# Patient Record
Sex: Female | Born: 1990 | Race: Black or African American | Hispanic: No | Marital: Single | Smoking: Former smoker
Health system: Southern US, Community
[De-identification: ages and names within clinical notes are randomized; demographics above are authoritative.]

## PROBLEM LIST (undated history)

## (undated) ENCOUNTER — Ambulatory Visit (HOSPITAL_COMMUNITY): Admission: EM | Payer: Medicaid Other | Source: Home / Self Care

## (undated) DIAGNOSIS — R51 Headache: Secondary | ICD-10-CM

## (undated) DIAGNOSIS — R519 Headache, unspecified: Secondary | ICD-10-CM

## (undated) DIAGNOSIS — F32A Depression, unspecified: Secondary | ICD-10-CM

## (undated) DIAGNOSIS — F419 Anxiety disorder, unspecified: Secondary | ICD-10-CM

## (undated) DIAGNOSIS — F259 Schizoaffective disorder, unspecified: Secondary | ICD-10-CM

## (undated) DIAGNOSIS — F329 Major depressive disorder, single episode, unspecified: Secondary | ICD-10-CM

## (undated) HISTORY — DX: Depression, unspecified: F32.A

## (undated) HISTORY — DX: Headache: R51

## (undated) HISTORY — DX: Headache, unspecified: R51.9

## (undated) HISTORY — DX: Major depressive disorder, single episode, unspecified: F32.9

---

## 2013-07-25 HISTORY — PX: FINGER SURGERY: SHX640

## 2014-11-25 ENCOUNTER — Encounter (HOSPITAL_COMMUNITY): Payer: Self-pay | Admitting: *Deleted

## 2014-11-25 ENCOUNTER — Emergency Department (HOSPITAL_COMMUNITY)
Admission: EM | Admit: 2014-11-25 | Discharge: 2014-11-25 | Disposition: A | Discharge status: Home / Self Care | Payer: Self-pay | Attending: Emergency Medicine | Admitting: Emergency Medicine

## 2014-11-25 DIAGNOSIS — M549 Dorsalgia, unspecified: Secondary | ICD-10-CM

## 2014-11-25 DIAGNOSIS — A599 Trichomoniasis, unspecified: Secondary | ICD-10-CM | POA: Insufficient documentation

## 2014-11-25 DIAGNOSIS — Z3202 Encounter for pregnancy test, result negative: Secondary | ICD-10-CM | POA: Insufficient documentation

## 2014-11-25 DIAGNOSIS — N898 Other specified noninflammatory disorders of vagina: Secondary | ICD-10-CM | POA: Insufficient documentation

## 2014-11-25 DIAGNOSIS — G8929 Other chronic pain: Secondary | ICD-10-CM | POA: Insufficient documentation

## 2014-11-25 LAB — WET PREP, GENITAL
Clue Cells Wet Prep HPF POC: NONE SEEN
Yeast Wet Prep HPF POC: NONE SEEN

## 2014-11-25 LAB — URINALYSIS, ROUTINE W REFLEX MICROSCOPIC
BILIRUBIN URINE: NEGATIVE
Glucose, UA: NEGATIVE mg/dL
HGB URINE DIPSTICK: NEGATIVE
Ketones, ur: 15 mg/dL — AB
Nitrite: NEGATIVE
PROTEIN: NEGATIVE mg/dL
Specific Gravity, Urine: 1.024 (ref 1.005–1.030)
Urobilinogen, UA: 1 mg/dL (ref 0.0–1.0)
pH: 7.5 (ref 5.0–8.0)

## 2014-11-25 LAB — URINE MICROSCOPIC-ADD ON

## 2014-11-25 LAB — POC URINE PREG, ED: Preg Test, Ur: NEGATIVE

## 2014-11-25 MED ORDER — ONDANSETRON HCL 4 MG PO TABS
4.0000 mg | ORAL_TABLET | Freq: Once | ORAL | Status: AC
  Administered 2014-11-25: 4 mg via ORAL
  Filled 2014-11-25: qty 1

## 2014-11-25 MED ORDER — AZITHROMYCIN 250 MG PO TABS
1000.0000 mg | ORAL_TABLET | Freq: Once | ORAL | Status: AC
  Administered 2014-11-25: 1000 mg via ORAL
  Filled 2014-11-25: qty 4

## 2014-11-25 MED ORDER — ONDANSETRON HCL 4 MG/2ML IJ SOLN: 4.0000 mg | Freq: Once | INTRAMUSCULAR | Status: DC

## 2014-11-25 MED ORDER — LIDOCAINE HCL (PF) 1 % IJ SOLN: 0.9000 mL | Freq: Once | INTRAMUSCULAR | Status: DC

## 2014-11-25 MED ORDER — CEFTRIAXONE SODIUM 250 MG IJ SOLR
250.0000 mg | Freq: Once | INTRAMUSCULAR | Status: DC
  Filled 2014-11-25: qty 250

## 2014-11-25 MED ORDER — LIDOCAINE HCL (PF) 1 % IJ SOLN
9.0000 mL | Freq: Once | INTRAMUSCULAR | Status: DC
  Filled 2014-11-25: qty 10

## 2014-11-25 MED ORDER — METRONIDAZOLE 500 MG PO TABS
2000.0000 mg | ORAL_TABLET | Freq: Once | ORAL | Status: AC
  Administered 2014-11-25: 2000 mg via ORAL
  Filled 2014-11-25: qty 4
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
## 2014-11-26 LAB — URINE CULTURE

## 2015-02-02 ENCOUNTER — Emergency Department (HOSPITAL_COMMUNITY)
Admission: EM | Admit: 2015-02-02 | Discharge: 2015-02-02 | Disposition: A | Discharge status: Home / Self Care | Payer: Self-pay | Attending: Emergency Medicine | Admitting: Emergency Medicine

## 2015-02-02 ENCOUNTER — Encounter (HOSPITAL_COMMUNITY): Payer: Self-pay | Admitting: Emergency Medicine

## 2015-02-02 DIAGNOSIS — R51 Headache: Secondary | ICD-10-CM | POA: Insufficient documentation

## 2015-02-02 DIAGNOSIS — R6883 Chills (without fever): Secondary | ICD-10-CM | POA: Insufficient documentation

## 2015-02-02 DIAGNOSIS — J029 Acute pharyngitis, unspecified: Secondary | ICD-10-CM | POA: Insufficient documentation

## 2015-02-02 DIAGNOSIS — M791 Myalgia: Secondary | ICD-10-CM | POA: Insufficient documentation

## 2015-02-02 LAB — RAPID STREP SCREEN (MED CTR MEBANE ONLY): Streptococcus, Group A Screen (Direct): NEGATIVE

## 2015-02-02 NOTE — ED Notes (Signed)
Pt. reports sore throat with swelling , mild chills and body aches onset 2 days ago .

## 2015-02-02 NOTE — ED Provider Notes (Signed)
CSN: [id number]     Arrival date & time 02/02/15  2001 History  This chart was scribed for non-physician practitioner, Cheron SchaumannLeslie Sofia, PA-C working with Gerhard Munchobert Lockwood, MD by Placido SouLogan Joldersma, ED scribe. This patient was seen in room TR07C/TR07C and the patient's care was started at 8:26 PM.     Chief Complaint  Patient presents with  . Sore Throat   Patient is a 24 y.o. female presenting with pharyngitis. The history is provided by the patient. No language interpreter was used.  Sore Throat Associated symptoms include headaches.    HPI Comments: Shelby Shelton is a 24 y.o. female who presents to the Emergency Department complaining of a worsening, mild, sore throat with onset 2 days ago. Pt notes associated chills, myalgias, HA, and trouble swallowing. She notes taking Theraflu for her symptoms and experiencing short term relief. She denies taking any medications or any known drug allergies. She denies any other associated symptoms.    History reviewed. No pertinent past medical history. Past Surgical History  Procedure Laterality Date  . Finger surgery     No family history on file. History  Substance Use Topics  . Smoking status: Never Smoker   . Smokeless tobacco: Not on file  . Alcohol Use: No   OB History    No data available     Review of Systems  Constitutional: Positive for chills.  HENT: Positive for sore throat and trouble swallowing.   Musculoskeletal: Positive for myalgias.  Neurological: Positive for headaches.  All other systems reviewed and are negative.  Allergies  Review of patient's allergies indicates no known allergies.  Home Medications   Prior to Admission medications   Not on File   BP 120/80 mmHg  Pulse 85  Temp(Src) 98.6 F (37 C) (Oral)  Resp 14  Ht 5\' 3"  (1.6 m)  Wt 92 lb (41.731 kg)  BMI 16.30 kg/m2  SpO2 98%  LMP 01/19/2015 Physical Exam  Constitutional: She is oriented to person, place, and time. She appears well-developed and  well-nourished. No distress.  HENT:  Head: Normocephalic and atraumatic.  Mouth/Throat: Oropharynx is clear and moist.  Eyes: Conjunctivae and EOM are normal. Pupils are equal, round, and reactive to light.  Neck: Normal range of motion. Neck supple. No tracheal deviation present.  Cardiovascular: Normal rate.   Pulmonary/Chest: Breath sounds normal. No respiratory distress.  Abdominal: Soft.  Musculoskeletal: Normal range of motion.  Neurological: She is alert and oriented to person, place, and time.  Skin: Skin is warm and dry.  Psychiatric: She has a normal mood and affect. Her behavior is normal.  Nursing note and vitals reviewed.   ED Course  Procedures  DIAGNOSTIC STUDIES: Oxygen Saturation is 98% on RA, normal by my interpretation.    COORDINATION OF CARE: 8:28 PM Discussed treatment plan with pt at bedside including a strep test and pt agreed to plan.  Labs Review Labs Reviewed  RAPID STREP SCREEN (NOT AT [facility])    Imaging Review No results found.   EKG Interpretation None      MDM  Strep negarive   Final diagnoses:  Pharyngitis     I personally performed the services in this documentation, which was scribed in my presence.  The recorded information has been reviewed and considered.   Barnet PallKaren SofiaPAC.  Elson AreasLeslie K Sofia, PA-C 02/02/15 2122  Elson AreasLeslie K Sofia, PA-C 02/02/15 2124  Gerhard Munchobert Lockwood, MD 02/03/15 [phone]

## 2015-02-02 NOTE — Discharge Instructions (Signed)

## 2015-02-05 LAB — CULTURE, GROUP A STREP: STREP A CULTURE: NEGATIVE

## 2017-05-21 ENCOUNTER — Emergency Department (HOSPITAL_COMMUNITY): Payer: Self-pay

## 2017-05-21 ENCOUNTER — Emergency Department (HOSPITAL_COMMUNITY)
Admission: EM | Admit: 2017-05-21 | Discharge: 2017-05-21 | Disposition: A | Discharge status: Home / Self Care | Payer: Self-pay | Attending: Emergency Medicine | Admitting: Emergency Medicine

## 2017-05-21 ENCOUNTER — Encounter (HOSPITAL_COMMUNITY): Payer: Self-pay | Admitting: Emergency Medicine

## 2017-05-21 DIAGNOSIS — G44209 Tension-type headache, unspecified, not intractable: Secondary | ICD-10-CM | POA: Insufficient documentation

## 2017-05-21 NOTE — Discharge Instructions (Signed)
Your headache is most likely stress or tension related.  Take Tylenol every 4 hours as needed for pain.  Follow-up with a primary care doctor if you are not better in 1 or 2 weeks.
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
## 2017-05-21 NOTE — ED Notes (Addendum)
The pthas had a headache for 1-2 mmonths intermittently  Woke up with a heradache this aM THEN WENT AWAY  SHE WENT OT WORK AND THE HEADACHE RETURNED   SHE TOOK TYLENOL THAT TOOK HER HEADAXCHE AWAY FOR APPROS 15-20 MINUTES THEN THE HEADACHE RETURNED

## 2017-05-21 NOTE — ED Notes (Signed)
TO CT

## 2017-05-21 NOTE — ED Triage Notes (Signed)
Pt brought in by EMS from work with c/o intermittent headache x 1 month. Pt has tried tylenol without relief. When headaches began pt called EMS and was found to have elevated BP but refused transport at that time. Today BP within normal limits.

## 2017-05-21 NOTE — ED Notes (Signed)
PT RETURNED FROM [facility] ASKING   FOR FOOD

## 2017-05-28 ENCOUNTER — Ambulatory Visit (HOSPITAL_COMMUNITY)
Admission: EM | Admit: 2017-05-28 | Discharge: 2017-05-28 | Disposition: A | Discharge status: Home / Self Care | Payer: Self-pay | Attending: Family Medicine | Admitting: Family Medicine

## 2017-05-28 ENCOUNTER — Encounter (HOSPITAL_COMMUNITY): Payer: Self-pay | Admitting: *Deleted

## 2017-05-28 DIAGNOSIS — F5101 Primary insomnia: Secondary | ICD-10-CM

## 2017-05-28 DIAGNOSIS — F4323 Adjustment disorder with mixed anxiety and depressed mood: Secondary | ICD-10-CM

## 2017-05-28 MED ORDER — FLUOXETINE HCL 10 MG PO TABS: 10.0000 mg | ORAL_TABLET | Freq: Every day | ORAL | 3 refills | Status: DC
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
## 2017-05-28 NOTE — ED Triage Notes (Signed)
C/O HAs intermittently over past 2 months, along with feeling anxious.  States under a lot of stress right now; feels depressed.  Denies suicidal or homicidal ideation.
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
## 2017-06-07 ENCOUNTER — Inpatient Hospital Stay: Payer: Self-pay

## 2017-10-19 ENCOUNTER — Other Ambulatory Visit: Payer: Self-pay

## 2017-10-19 ENCOUNTER — Encounter (HOSPITAL_COMMUNITY): Payer: Self-pay | Admitting: Emergency Medicine

## 2017-10-19 ENCOUNTER — Ambulatory Visit (HOSPITAL_COMMUNITY)
Admission: EM | Admit: 2017-10-19 | Discharge: 2017-10-19 | Disposition: A | Discharge status: Home / Self Care | Payer: Self-pay | Attending: Emergency Medicine | Admitting: Emergency Medicine

## 2017-10-19 DIAGNOSIS — B360 Pityriasis versicolor: Secondary | ICD-10-CM

## 2017-10-19 MED ORDER — KETOCONAZOLE 2 % EX SHAM: MEDICATED_SHAMPOO | CUTANEOUS | 0 refills | Status: DC

## 2017-10-19 NOTE — ED Provider Notes (Signed)
[facility]    CSN: [id number] Arrival date & time: 10/19/17  1241     History   Chief Complaint Chief Complaint  Patient presents with  . Rash    HPI Shelby Shelton is a 27 y.o. female.   Lavona presents with complaints of rash to her back she has noticed over the past 1-2 months. She feels the affected area has gotten larger. Denies  Any new products, soaps shampoos etc. She applies oil to her skin after showering. Denies any previous similar. Has not tried any treatments for her symptoms. It is not painful and is rarely itchy. Has not drained or flaked.    ROS per HPI.      History reviewed. No pertinent past medical history.  There are no active problems to display for this patient.   Past Surgical History:  Procedure Laterality Date  . FINGER SURGERY      OB History   None      Home Medications    Prior to Admission medications   Medication Sig Start Date End Date Taking? Authorizing Provider  ketoconazole (NIZORAL) 2 % shampoo Apply to back and leave on for 5 minutes, once a day for 1 week, then three times a week until resolved 10/19/17   Georgetta Haber, NP    Family History Family History  Problem Relation Age of Onset  . Hypertension Mother     Social History Social History   Tobacco Use  . Smoking status: Never Smoker  . Smokeless tobacco: Never Used  Substance Use Topics  . Alcohol use: No  . Drug use: No     Allergies   Patient has no known allergies.   Review of Systems Review of Systems   Physical Exam Triage Vital Signs ED Triage Vitals  Enc Vitals Group     BP 10/19/17 1345 115/82     Pulse Rate 10/19/17 1345 83     Resp 10/19/17 1345 18     Temp 10/19/17 1345 98.1 F (36.7 C)     Temp Source 10/19/17 1345 Oral     SpO2 10/19/17 1345 99 %     Weight --      Height --      Head Circumference --      Peak Flow --      Pain Score 10/19/17 1343 0     Pain Loc --      Pain Edu? --      Excl. in  GC? --    No data found.  Updated Vital Signs BP 115/82 (BP Location: Left Arm)   Pulse 83   Temp 98.1 F (36.7 C) (Oral)   Resp 18   LMP 09/28/2017   SpO2 99%   Visual Acuity Right Eye Distance:   Left Eye Distance:   Bilateral Distance:    Right Eye Near:   Left Eye Near:    Bilateral Near:     Physical Exam  Constitutional: She is oriented to person, place, and time. She appears well-developed and well-nourished. No distress.  Cardiovascular: Normal rate, regular rhythm and normal heart sounds.  Pulmonary/Chest: Effort normal and breath sounds normal.  Neurological: She is alert and oriented to person, place, and time.  Skin: Skin is warm and dry. Rash noted.  Flat hypopigmented rash to back noted without flaky or raised margins; see photos          UC Treatments / Results  Labs (all labs ordered are listed, but  only abnormal results are displayed) Labs Reviewed - No data to display  EKG None Radiology No results found.  Procedures Procedures (including critical care time)  Medications Ordered in UC Medications - No data to display   Initial Impression / Assessment and Plan / UC Course  I have reviewed the triage vital signs and the nursing notes.  Pertinent labs & imaging results that were available during my care of the patient were reviewed by me and considered in my medical decision making (see chart for details).     Ketoconazole shampoo provided at this time. If symptoms worsen or do not improve in the next 1-3 weeks to return to be seen or to follow up with PCP.  Patient verbalized understanding and agreeable to plan.    Final Clinical Impressions(s) / UC Diagnoses   Final diagnoses:  Tinea versicolor    ED Discharge Orders        Ordered    ketoconazole (NIZORAL) 2 % shampoo     10/19/17 1438       Controlled Substance Prescriptions [place] Controlled Substance Registry consulted? Not Applicable   Georgetta HaberBurky, Natalie B, NP 10/19/17  1444

## 2017-10-19 NOTE — Discharge Instructions (Signed)
Use of shampoo to back for the next week, leave on for 5 minutes then rinse off. Then three times a week until resolves. If symptoms worsen or do not improve in the next 1-3 weeks to return to be seen or to follow up with a PCP.

## 2017-10-19 NOTE — ED Triage Notes (Signed)
Describes white specks of lighter skin on her back noticed 1-2 months ago.  Specks are increasing

## 2017-12-09 ENCOUNTER — Encounter (HOSPITAL_COMMUNITY): Payer: Self-pay | Admitting: Emergency Medicine

## 2017-12-09 ENCOUNTER — Ambulatory Visit (HOSPITAL_COMMUNITY)
Admission: EM | Admit: 2017-12-09 | Discharge: 2017-12-09 | Disposition: A | Discharge status: Home / Self Care | Payer: Self-pay | Attending: Internal Medicine | Admitting: Internal Medicine

## 2017-12-09 ENCOUNTER — Other Ambulatory Visit: Payer: Self-pay

## 2017-12-09 DIAGNOSIS — R5383 Other fatigue: Secondary | ICD-10-CM

## 2017-12-09 LAB — POCT I-STAT, CHEM 8
BUN: 14 mg/dL (ref 6–20)
CHLORIDE: 103 mmol/L (ref 101–111)
Calcium, Ion: 1.17 mmol/L (ref 1.15–1.40)
Creatinine, Ser: 0.6 mg/dL (ref 0.44–1.00)
GLUCOSE: 80 mg/dL (ref 65–99)
HEMATOCRIT: 44 % (ref 36.0–46.0)
HEMOGLOBIN: 15 g/dL (ref 12.0–15.0)
POTASSIUM: 3.8 mmol/L (ref 3.5–5.1)
Sodium: 140 mmol/L (ref 135–145)
TCO2: 25 mmol/L (ref 22–32)

## 2017-12-09 MED ORDER — ONDANSETRON 4 MG PO TBDP: 4.0000 mg | ORAL_TABLET | Freq: Three times a day (TID) | ORAL | 0 refills | Status: DC | PRN

## 2017-12-09 NOTE — Discharge Instructions (Addendum)
Please use zofran as needed  Please work on getting longer episodes of sleep; at least 6 hours  Please increase fluid intake and food intake

## 2017-12-09 NOTE — ED Triage Notes (Signed)
Patient reports a week of feeling low energy.  Patient has had difficulty sleeping over the past week.  Patient has a headache as well

## 2017-12-10 NOTE — ED Provider Notes (Signed)
[facility]    CSN: [id number] Arrival date & time: 12/09/17  1604     History   Chief Complaint Chief Complaint  Patient presents with  . Dizziness    HPI Shelby Shelton is a 27 y.o. female no significant past medical history presenting today for evaluation of fatigue.  Patient states that she has felt really drained and has had difficulty sleeping over the past week.  She has had low energy as well as having more frequent headaches-taking BC powder for headaches.  She has occasional nausea associated with her fatigue.  And decreased appetite.  Denies any dizziness and lightheadedness.  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  She feels she is overworked, frequently working 9 days in a row with only 1 day off and working multiple shifts back to back.  States that she typically only gets 3 to 4 hours of sleep at night.  Does endorse trying to eat relatively healthfully eating mainly salads.  Last menstrual period was around 5/5, not on any form of birth control.  Did note that this past menstrual cycle was heavier than normal and lasted approximately 9 days which is longer than normal for her.  Denies any abdominal pain.  Denies being sexually active.  Patient was seen here in December for similar symptoms, patient states that this feels like another episode of this.  She was started on Prozac which she has taken off and on since then.  States that this did help her, especially with sleep.  Patient does not have a PCP.  HPI  History reviewed. No pertinent past medical history.  There are no active problems to display for this patient.   Past Surgical History:  Procedure Laterality Date  . FINGER SURGERY      OB History   None      Home Medications    Prior to Admission medications   Medication Sig Start Date End Date Taking? Authorizing Provider  ketoconazole (NIZORAL) 2 % shampoo Apply to back and leave on for 5  minutes, once a day for 1 week, then three times a week until resolved 10/19/17   Linus Mako B, NP  ondansetron (ZOFRAN ODT) 4 MG disintegrating tablet Take 1 tablet (4 mg total) by mouth every 8 (eight) hours as needed for nausea or vomiting. 12/09/17   Lew Dawes, PA-C    Family History Family History  Problem Relation Age of Onset  . Hypertension Mother     Social History Social History   Tobacco Use  . Smoking status: Never Smoker  . Smokeless tobacco: Never Used  Substance Use Topics  . Alcohol use: No  . Drug use: No     Allergies   Patient has no known allergies.   Review of Systems Review of Systems  Constitutional: Positive for activity change, appetite change and fatigue. Negative for chills and fever.  HENT: Negative for congestion, ear pain and sore throat.   Eyes: Negative for visual disturbance.  Respiratory: Negative for cough and shortness of breath.   Cardiovascular: Negative for chest pain and palpitations.  Gastrointestinal: Positive for nausea. Negative for abdominal pain, blood in stool and vomiting.  Musculoskeletal: Negative for arthralgias and back pain.  Skin: Negative for color change and rash.  Neurological: Positive for headaches. Negative for seizures, syncope, weakness, light-headedness and numbness.  All other systems reviewed and are negative.    Physical Exam Triage Vital Signs ED Triage Vitals  Enc Vitals  Group     BP 12/09/17 1728 126/79     Pulse Rate 12/09/17 1728 77     Resp 12/09/17 1728 16     Temp 12/09/17 1728 98 F (36.7 C)     Temp Source 12/09/17 1728 Oral     SpO2 12/09/17 1728 100 %     Weight --      Height --      Head Circumference --      Peak Flow --      Pain Score 12/09/17 1726 8     Pain Loc --      Pain Edu? --      Excl. in GC? --    No data found.  Updated Vital Signs BP 126/79 (BP Location: Left Arm)   Pulse 77   Temp 98 F (36.7 C) (Oral)   Resp 16   SpO2 100%   Visual  Acuity Right Eye Distance:   Left Eye Distance:   Bilateral Distance:    Right Eye Near:   Left Eye Near:    Bilateral Near:     Physical Exam  Constitutional: She is oriented to person, place, and time. She appears well-developed and well-nourished. No distress.  HENT:  Head: Normocephalic and atraumatic.  Mouth/Throat: Oropharynx is clear and moist.  Eyes: Conjunctivae are normal.  Neck: Neck supple.  Cardiovascular: Normal rate and regular rhythm.  No murmur heard. Pulmonary/Chest: Effort normal and breath sounds normal. No respiratory distress.  Abdominal: Soft. There is no tenderness.  Nontender to light and deep palpation  Musculoskeletal: She exhibits no edema.  Neurological: She is alert and oriented to person, place, and time.  Patient A&O x3, cranial nerves II-XII grossly intact, strength at shoulders, hips and knees 5/5, equal bilaterally. Normal Finger to nose, RAM and heel to shin. Negative Romberg and Pronator Drift. Gait without abnormality.   Skin:  Bilateral hands cold and sweaty  Psychiatric: She has a normal mood and affect.  Nursing note and vitals reviewed.    UC Treatments / Results  Labs (all labs ordered are listed, but only abnormal results are displayed) Labs Reviewed  POCT I-STAT, CHEM 8    EKG None  Radiology No results found.  Procedures Procedures (including critical care time)  Medications Ordered in UC Medications - No data to display  Initial Impression / Assessment and Plan / UC Course  I have reviewed the triage vital signs and the nursing notes.  Pertinent labs & imaging results that were available during my care of the patient were reviewed by me and considered in my medical decision making (see chart for details).     Patient with significant fatigue, possible underlying depression/anxiety.  Patient improved by taking Prozac previously, states that she does have a couple refills.  She is interested in restarting this.   I-STAT obtained, hemoglobin normal, electrolytes normal glucose normal.  Discussed certain lifestyle changes including better sleep hygiene, increase fluid intake as well as increasing her diet to provide her with more energy.  Will provide her Zofran to use as needed for nausea.  Recommended NSAIDs for headache.  Discussed with patient getting set up with PCP.  Work note provided for 2 days to stay home and rest.  Discussed strict return precautions. Patient verbalized understanding and is agreeable with plan.  Final Clinical Impressions(s) / UC Diagnoses   Final diagnoses:  Fatigue, unspecified type     Discharge Instructions     Please use zofran as needed  Please work on getting longer episodes of sleep; at least 6 hours  Please increase fluid intake and food intake   ED Prescriptions    Medication Sig Dispense Auth. Provider   ondansetron (ZOFRAN ODT) 4 MG disintegrating tablet Take 1 tablet (4 mg total) by mouth every 8 (eight) hours as needed for nausea or vomiting. 20 tablet Wieters, [place] C, PA-C     Controlled Substance Prescriptions [place] Controlled Substance Registry consulted? Not Applicable   Lew Dawes, [place] 12/10/17 1048

## 2017-12-12 ENCOUNTER — Telehealth (HOSPITAL_COMMUNITY): Payer: Self-pay | Admitting: Emergency Medicine

## 2017-12-12 ENCOUNTER — Encounter (HOSPITAL_COMMUNITY): Payer: Self-pay | Admitting: Family Medicine

## 2017-12-12 ENCOUNTER — Ambulatory Visit (HOSPITAL_COMMUNITY)
Admission: EM | Admit: 2017-12-12 | Discharge: 2017-12-12 | Disposition: A | Discharge status: Home / Self Care | Payer: Self-pay | Attending: Family Medicine | Admitting: Family Medicine

## 2017-12-12 DIAGNOSIS — G44209 Tension-type headache, unspecified, not intractable: Secondary | ICD-10-CM

## 2017-12-12 MED ORDER — NAPROXEN 500 MG PO TABS: 500.0000 mg | ORAL_TABLET | Freq: Two times a day (BID) | ORAL | 0 refills | Status: DC

## 2017-12-12 NOTE — Telephone Encounter (Signed)
Attempted to return pt call without answer

## 2017-12-12 NOTE — ED Provider Notes (Signed)
[facility]    CSN: [id number] Arrival date & time: 12/12/17  1503     History   Chief Complaint Chief Complaint  Patient presents with  . Headache    HPI Shelby Shelton is a 27 y.o. female no significant past medical history presenting today for evaluation of headaches.  Patient states that she has frequent headaches, will take [facility] powders which will relieve them temporarily.  Associated with phonophobia, denies photophobia.  Occasional blurry vision, but nothing persistent.  Denies any nausea or vomiting.  Patient was recently here for fatigue/tired related to over working/stress.  Patient states that this is improved as she was able to rest over the weekend.  She began to take fluoxetine as this previously it helped her with sleeping, but states that this is not helped her headaches.  She was seen and October/2018 for frequent headaches in the emergency room, CT scan showed no abnormalities.  Patient requesting referral to neurology.  HPI  History reviewed. No pertinent past medical history.  There are no active problems to display for this patient.   Past Surgical History:  Procedure Laterality Date  . FINGER SURGERY      OB History   None      Home Medications    Prior to Admission medications   Medication Sig Start Date End Date Taking? Authorizing Provider  ketoconazole (NIZORAL) 2 % shampoo Apply to back and leave on for 5 minutes, once a day for 1 week, then three times a week until resolved 10/19/17   Linus Mako B, NP  naproxen (NAPROSYN) 500 MG tablet Take 1 tablet (500 mg total) by mouth 2 (two) times daily. 12/12/17   Tkai Serfass C, PA-C  ondansetron (ZOFRAN ODT) 4 MG disintegrating tablet Take 1 tablet (4 mg total) by mouth every 8 (eight) hours as needed for nausea or vomiting. 12/09/17   Sheenah Dimitroff, Junius Creamer, PA-C    Family History Family History  Problem Relation Age of Onset  . Hypertension Mother     Social History Social History     Tobacco Use  . Smoking status: Never Smoker  . Smokeless tobacco: Never Used  Substance Use Topics  . Alcohol use: No  . Drug use: No     Allergies   Patient has no known allergies.   Review of Systems Review of Systems  Constitutional: Positive for fatigue. Negative for activity change, appetite change, chills and fever.  HENT: Negative for ear pain, sore throat and trouble swallowing.   Eyes: Negative for photophobia, pain and visual disturbance.  Respiratory: Negative for cough and shortness of breath.   Cardiovascular: Negative for chest pain and palpitations.  Gastrointestinal: Negative for abdominal pain, nausea and vomiting.  Musculoskeletal: Negative for back pain, neck pain and neck stiffness.  Skin: Negative for color change, rash and wound.  Neurological: Positive for headaches. Negative for dizziness, seizures, syncope, weakness, light-headedness and numbness.  All other systems reviewed and are negative.    Physical Exam Triage Vital Signs ED Triage Vitals  Enc Vitals Group     BP 12/12/17 1617 128/87     Pulse Rate 12/12/17 1617 75     Resp 12/12/17 1617 18     Temp 12/12/17 1617 98.3 F (36.8 C)     Temp src --      SpO2 12/12/17 1617 99 %     Weight --      Height --      Head Circumference --  Peak Flow --      Pain Score 12/12/17 1606 7     Pain Loc --      Pain Edu? --      Excl. in GC? --    No data found.  Updated Vital Signs BP 128/87   Pulse 75   Temp 98.3 F (36.8 C)   Resp 18   SpO2 99%   Visual Acuity Right Eye Distance:   Left Eye Distance:   Bilateral Distance:    Right Eye Near:   Left Eye Near:    Bilateral Near:     Physical Exam  Constitutional: She is oriented to person, place, and time. She appears well-developed and well-nourished. No distress.  HENT:  Head: Normocephalic and atraumatic.  Eyes: Conjunctivae are normal.  Neck: Neck supple.  Cardiovascular: Normal rate and regular rhythm.  No murmur  heard. Pulmonary/Chest: Effort normal and breath sounds normal. No respiratory distress.  Abdominal: Soft. There is no tenderness.  Musculoskeletal: She exhibits no edema.  Neurological: She is alert and oriented to person, place, and time.  Patient A&O x3, cranial nerves II-XII grossly intact, strength at shoulders, hips and knees 5/5, equal bilaterally.  Gait without abnormality.   Skin: Skin is warm and dry.  Psychiatric: She has a normal mood and affect.  Nursing note and vitals reviewed.    UC Treatments / Results  Labs (all labs ordered are listed, but only abnormal results are displayed) Labs Reviewed - No data to display  EKG None  Radiology No results found.  Procedures Procedures (including critical care time)  Medications Ordered in UC Medications - No data to display  Initial Impression / Assessment and Plan / UC Course  I have reviewed the triage vital signs and the nursing notes.  Pertinent labs & imaging results that were available during my care of the patient were reviewed by me and considered in my medical decision making (see chart for details).     Patient with recurrent headaches, offered shot of Toradol/Decadron.  Headache appears tension type, no focal neuro deficits.  Patient declined.  Also offered to check TSH/B12, patient declined.  Will provide Naprosyn to use at home orally.  Discussed getting set up with [facility], provided contact info for neurology. Final Clinical Impressions(s) / UC Diagnoses   Final diagnoses:  Acute non intractable tension-type headache     Discharge Instructions     Naprosyn twice daily as needed for headaches or may try over the counter alleeve Or  Use anti-inflammatories for pain/swelling. You may take up to 800 mg Ibuprofen every 8 hours with food. You may supplement Ibuprofen with Tylenol [phone] mg every 8 hours.      ED Prescriptions    Medication Sig Dispense Auth. Provider    naproxen (NAPROSYN) 500 MG tablet  (Status: Discontinued) Take 1 tablet (500 mg total) by mouth 2 (two) times daily. 30 tablet Darcie Mellone C, PA-C   naproxen (NAPROSYN) 500 MG tablet Take 1 tablet (500 mg total) by mouth 2 (two) times daily. 30 tablet Haleem Hanner, [place] C, PA-C     Controlled Substance Prescriptions [place] Controlled Substance Registry consulted? Not Applicable   Lew Dawes, [place] 12/12/17 1725

## 2017-12-12 NOTE — ED Triage Notes (Signed)
Pt here today with complaints of headaches. This has been ongoing for week and she has been taking tension headache meds OTC, she has had an MRI done and recently was prescribed fluoxetine to take 2 x a day here recently. She said that none of the treatments are working. She is very frustrated and possibly wants a referral to a Neurologist.

## 2017-12-12 NOTE — Discharge Instructions (Signed)
Naprosyn twice daily as needed for headaches or may try over the counter alleeve Or  Use anti-inflammatories for pain/swelling. You may take up to 800 mg Ibuprofen every 8 hours with food. You may supplement Ibuprofen with Tylenol [phone] mg every 8 hours.

## 2017-12-12 NOTE — Telephone Encounter (Signed)
Pt requesting continued work note; explained three day limit and encouraged pt to follow up here or with PCP

## 2017-12-14 ENCOUNTER — Encounter: Payer: Self-pay | Admitting: Neurology

## 2018-02-14 ENCOUNTER — Other Ambulatory Visit: Payer: Self-pay

## 2018-02-14 ENCOUNTER — Ambulatory Visit: Payer: Self-pay | Admitting: Neurology

## 2018-02-14 ENCOUNTER — Encounter: Payer: Self-pay | Admitting: Neurology

## 2018-02-14 VITALS — BP 108/66 | HR 76 | Ht 62.0 in | Wt 95.0 lb

## 2018-02-14 DIAGNOSIS — F419 Anxiety disorder, unspecified: Secondary | ICD-10-CM

## 2018-02-14 DIAGNOSIS — G43019 Migraine without aura, intractable, without status migrainosus: Secondary | ICD-10-CM

## 2018-02-14 MED ORDER — SUMATRIPTAN SUCCINATE 100 MG PO TABS: ORAL_TABLET | ORAL | 3 refills | Status: AC

## 2018-02-14 MED ORDER — NORTRIPTYLINE HCL 25 MG PO CAPS: 25.0000 mg | ORAL_CAPSULE | Freq: Every day | ORAL | 3 refills | Status: DC

## 2018-02-14 NOTE — Progress Notes (Signed)
NEUROLOGY CONSULTATION NOTE  Shelby Shelton MRN: [id number] DOB: August 07, 1990  Referring provider: ED referral (Dr. Mardella Layman) Primary care provider: No PCP  Reason for consult:  headaches  HISTORY OF PRESENT ILLNESS: Shelby Shelton is a 27 year old right-handed female with hyperhidrosis since childhood who presents for headache.  History supplemented by ED referring provider's note.  She is accompanied by her father who supplements history.  Onset:  Since childhood, but progressively gotten worse since 2018 Location:  Bi-frontal and top of head Quality:  Pounding, but she gets electric shocks when she hears noise Intensity:  Mild progressing to severe.  She denies new headache, thunderclap headache.  She wakes up with headaches. Aura:  no Prodrome:  no Postdrome:  no Associated symptoms:  Nausea, phonophobia, blurred vision, lethargy.  She denies associated vomiting, photophobia, osmophobia or unilateral numbness or weakness. Duration:  3 to 4 days Frequency:  4 times a month Frequency of abortive medication: BC powder daily Triggers:  no Exacerbating factors:  sounds Relieving factors:  nothing Activity:  aggravates  CT Head without contrast from 05/21/17 personally reviewed and was unremarkable.  Current NSAIDS:  naproxen 500mg  Current analgesics:  BC powder Current triptans:  no Current ergotamine:  no Current anti-emetic:  no Current muscle relaxants:  no Current anti-anxiolytic:  no Current sleep aide:  no Current Antihypertensive medications:  no Current Antidepressant medications:  no Current Anticonvulsant medications:  no Current anti-CGRP:  no Current Vitamins/Herbal/Supplements:  no Current Antihistamines/Decongestants:  no Other therapy:  no  Past NSAIDS:  Ibuprofen, naproxen  Past analgesics:  Excedrin migraine, tension headache, Tylenol Past abortive triptans:  no Past abortive ergotamine:  no Past muscle relaxants:  no Past anti-emetic:   no Past antihypertensive medications:  no Past antidepressant medications:  fluoxetine Past anticonvulsant medications:  no Past anti-CGRP:  no Past vitamins/Herbal/Supplements:  no Past antihistamines/decongestants:  no Other past therapies:  no  Caffeine:  no Alcohol:  no Smoker:  no Diet:  hydrates Exercise:  no Depression:  no; Anxiety:  yes Other pain:  Back pain Sleep hygiene:  poor Family history of headache:  Mom  12/09/17 Chem 8:  Na 140, K 3.8, Cl 103, BUN 14, Cr 0.60  PAST MEDICAL HISTORY: Past Medical History:  Diagnosis Date  . Depression   . Headache     PAST SURGICAL HISTORY: Past Surgical History:  Procedure Laterality Date  . FINGER SURGERY  2015    MEDICATIONS: Current Outpatient Medications on File Prior to Visit  Medication Sig Dispense Refill  . ketoconazole (NIZORAL) 2 % shampoo Apply to back and leave on for 5 minutes, once a day for 1 week, then three times a week until resolved 120 mL 0  . naproxen (NAPROSYN) 500 MG tablet Take 1 tablet (500 mg total) by mouth 2 (two) times daily. 30 tablet 0  . ondansetron (ZOFRAN ODT) 4 MG disintegrating tablet Take 1 tablet (4 mg total) by mouth every 8 (eight) hours as needed for nausea or vomiting. 20 tablet 0   No current facility-administered medications on file prior to visit.     ALLERGIES: No Known Allergies  FAMILY HISTORY: Family History  Problem Relation Age of Onset  . Hypertension Mother   . Heart defect Brother   . Asthma Brother     SOCIAL HISTORY: Social History   Socioeconomic History  . Marital status: Single    Spouse name: Not on file  . Number of children: Not on file  . Years of  education: Not on file  . Highest education level: Not on file  Occupational History  . Not on file  Social Needs  . Financial resource strain: Not on file  . Food insecurity:    Worry: Not on file    Inability: Not on file  . Transportation needs:    Medical: Not on file    Non-medical:  Not on file  Tobacco Use  . Smoking status: Former Smoker    Last attempt to quit: 12/15/2017    Years since quitting: 0.1  . Smokeless tobacco: Never Used  Substance and Sexual Activity  . Alcohol use: No  . Drug use: No  . Sexual activity: Never    Birth control/protection: None  Lifestyle  . Physical activity:    Days per week: Not on file    Minutes per session: Not on file  . Stress: Not on file  Relationships  . Social connections:    Talks on phone: Not on file    Gets together: Not on file    Attends religious service: Not on file    Active member of club or organization: Not on file    Attends meetings of clubs or organizations: Not on file    Relationship status: Not on file  . Intimate partner violence:    Fear of current or ex partner: Not on file    Emotionally abused: Not on file    Physically abused: Not on file    Forced sexual activity: Not on file  Other Topics Concern  . Not on file  Social History Narrative  . Not on file    REVIEW OF SYSTEMS: Constitutional: hyperhidrosis of hands and feet Eyes: No visual changes, double vision, eye pain Ear, nose and throat: No hearing loss, ear pain, nasal congestion, sore throat Cardiovascular: No chest pain, palpitations Respiratory:  No shortness of breath at rest or with exertion, wheezes GastrointestinaI: No nausea, vomiting, diarrhea, abdominal pain, fecal incontinence Genitourinary:  No dysuria, urinary retention or frequency Musculoskeletal:  Back pain Integumentary: No rash, pruritus, skin lesions Neurological: as above Psychiatric: insomnia, anxiety Endocrine: hyperhidrosis of hands and feet Hematologic/Lymphatic:  No purpura, petechiae. Allergic/Immunologic: no itchy/runny eyes, nasal congestion, recent allergic reactions, rashes  PHYSICAL EXAM: Vitals:   02/14/18 1238  BP: 108/66  Pulse: 76  SpO2: 98%   General: No acute distress.  Patient appears well-groomed.  Head:   Normocephalic/atraumatic Eyes:  fundi examined but not visualized Neck: supple, no paraspinal tenderness, full range of motion Back: No paraspinal tenderness Heart: regular rate and rhythm Lungs: Clear to auscultation bilaterally. Vascular: No carotid bruits. Neurological Exam: Mental status: alert and oriented to person, place, and time, recent and remote memory intact, fund of knowledge intact, attention and concentration intact, speech fluent and not dysarthric, language intact. Cranial nerves: CN I: not tested CN II: pupils equal, round and reactive to light, visual fields intact CN III, IV, VI:  full range of motion, no nystagmus, no ptosis CN V: facial sensation intact CN VII: upper and lower face symmetric CN VIII: hearing intact CN IX, X: gag intact, uvula midline CN XI: sternocleidomastoid and trapezius muscles intact CN XII: tongue midline Bulk & Tone: normal, no fasciculations. Motor:  5/5 throughout  Sensation:  temperature and vibration sensation intact. Deep Tendon Reflexes:  2+ throughout, toes downgoing.  Finger to nose testing:  Without dysmetria.  Heel to shin:  Without dysmetria.  Gait:  Normal station and stride.  Able to turn and tandem walk.  Romberg negative.  IMPRESSION: Migraine without aura, without status migrainosus, intractable Anxiety  PLAN: 1.  Start nortriptyline 25mg  at bedtime.  Call in 4 weeks with update and we can adjust dose if needed. 2.  Take sumatriptan 100mg  at earliest onset of headache.  May repeat dose once in 2 hours if needed.  Do not exceed two tablets in 24 hours.  Take each dose of sumatriptan with naproxen 500mg . 3.  Limit use of pain relievers to no more than 2 days out of the week.  These medications include acetaminophen, ibuprofen, triptans and narcotics.  This will help reduce risk of rebound headaches. 4.  Be aware of common food triggers such as processed sweets, processed foods with nitrites (such as deli meat, hot dogs,  sausages), foods with MSG, alcohol (such as wine), chocolate, certain cheeses, certain fruits (dried fruits, bananas, some citrus fruit), vinegar, diet soda. 4.  Avoid caffeine 5.  Routine exercise 6.  Proper sleep hygiene 7.  Stay adequately hydrated with water 8.  Keep a headache diary. 9.  Maintain proper stress management. 10.  Do not skip meals. 11.  Consider supplements:  Magnesium citrate 400mg  to 600mg  daily, riboflavin 400mg , Coenzyme Q 10 100mg  three times daily 12.  Advised to sign up for the [facility], we can order MRI of brain, as she has had increased frequency and intensity of headaches. 13.  Follow up in 4 months.  Thank you for allowing me to take part in the care of this patient.  Shon Millet, DO

## 2018-02-14 NOTE — Patient Instructions (Signed)
Migraine Recommendations: 1.  Start nortriptyline 25mg  at bedtime.  Call in 4 weeks with update and we can adjust dose if needed. 2.  Take sumatriptan 100mg  at earliest onset of headache.  May repeat dose once in 2 hours if needed.  Do not exceed two tablets in 24 hours.  Take each dose of sumatriptan with naproxen 500mg . 3.  Limit use of pain relievers to no more than 2 days out of the week.  These medications include acetaminophen, ibuprofen, triptans and narcotics.  This will help reduce risk of rebound headaches. 4.  Be aware of common food triggers such as processed sweets, processed foods with nitrites (such as deli meat, hot dogs, sausages), foods with MSG, alcohol (such as wine), chocolate, certain cheeses, certain fruits (dried fruits, bananas, some citrus fruit), vinegar, diet soda. 4.  Avoid caffeine 5.  Routine exercise 6.  Proper sleep hygiene 7.  Stay adequately hydrated with water 8.  Keep a headache diary. 9.  Maintain proper stress management. 10.  Do not skip meals. 11.  Consider supplements:  Magnesium citrate 400mg  to 600mg  daily, riboflavin 400mg , Coenzyme Q 10 100mg  three times daily 12.  If you get insurance or sign up for the [facility], we can order MRI of brain. 13.  Follow up in 4 months.

## 2018-02-27 ENCOUNTER — Encounter

## 2018-02-27 ENCOUNTER — Ambulatory Visit: Payer: Self-pay | Admitting: Neurology

## 2018-06-19 ENCOUNTER — Telehealth: Payer: Self-pay | Admitting: Neurology

## 2018-06-19 DIAGNOSIS — G43019 Migraine without aura, intractable, without status migrainosus: Secondary | ICD-10-CM

## 2018-06-19 DIAGNOSIS — R51 Headache: Secondary | ICD-10-CM

## 2018-06-19 DIAGNOSIS — R519 Headache, unspecified: Secondary | ICD-10-CM

## 2018-06-19 NOTE — Telephone Encounter (Signed)
Called and spoke with Cedric. I advised him I will talk with Dr Everlena CooperJaffe about increasing the nortriptyline to 50 mg QHS and call back tomorrow.  He states the Pt has been approved for Medicaid now and should receive her card next week. I advised him we do have her on our wait list and we should be able to see her within 2 weeks.

## 2018-06-19 NOTE — Telephone Encounter (Signed)
Patient's Uncle called to schedule a follow up appt and the next available is in February. She is on a wait list, but he said her headaches have gotten worse and the medication she was given for them is not helping. He said they had to cancel the last appt due to finances. Please Call. Thanks

## 2018-06-20 MED ORDER — NORTRIPTYLINE HCL 50 MG PO CAPS: 50.0000 mg | ORAL_CAPSULE | Freq: Every day | ORAL | 3 refills | Status: DC

## 2018-06-20 NOTE — Telephone Encounter (Signed)
Yes, increase nortriptyline to 50mg  at bedtime

## 2018-06-20 NOTE — Telephone Encounter (Signed)
Called and advised Shelby Shelton. Sent in Rx for 50 mg. En[facility], advised Shelby Shelton of scheduling number to call after receiving MCD card

## 2018-06-27 ENCOUNTER — Telehealth: Payer: Self-pay | Admitting: Neurology

## 2018-06-27 NOTE — Telephone Encounter (Signed)
Called and spoke with Pt, she has not yet rcvd her actual [facility] card, but [facility] she was given her recipient ID # [id number], last# could be an O instead of a zero She needs this authorized before [facility] will schedule MRI, [facility]

## 2018-06-27 NOTE — Telephone Encounter (Signed)
Patient called [phone] and she is needing to schedule [facility]. She called and they told her it needed to be Authorized? Please Advise. Thanks

## 2018-06-27 NOTE — Telephone Encounter (Signed)
Rcvd message from Shelby Shelton, MRI has [name] [id number] auth valid from 12/4 until 09/25/2018  Called Pt and let her know she can call and schedule

## 2018-06-27 NOTE — Telephone Encounter (Signed)
Patient gave wrong number earlier. New number is [phone]. Thanks

## 2018-06-27 NOTE — Progress Notes (Signed)
NEUROLOGY FOLLOW UP OFFICE NOTE  Shelby HaffMoneisha Quintin [id number]  HISTORY OF PRESENT ILLNESS: Shelby Shelton is a 27 year old right-handed female with hyperhidrosis since childhood who follows up for headaches.  She is accompanied by her uncle who supplements history.   UPDATE: She has had about 4 migraines over the past month.  Now she reports headache in the back of her head as well with neck pain.  She reports continued sweating of the extremities, tremulousness of the head and hands, feeling cold.  She reports some memory issues. Intensity:  severe Duration:  All day. Frequency:  4 days over past month. Frequency of abortive medication: as needed Current NSAIDS:  none Current analgesics:  none Current triptans:  Sumatriptan 100mg .  Ineffective. Current ergotamine:  none Current anti-emetic:  none Current muscle relaxants:  none Current anti-anxiolytic:  none Current sleep aide:  none Current Antihypertensive medications:  none Current Antidepressant medications:  Nortriptyline 25mg  at bedtime Current Anticonvulsant medications:  none Current anti-CGRP:  none Current Vitamins/Herbal/Supplements:  none Current Antihistamines/Decongestants:  none Other therapy:  none Other medication:  none  Caffeine:  no Alcohol:  no Smoker:  no Diet:  hydrates Exercise:  no Depression:  no; Anxiety:  yes Other pain:  Back pain Sleep hygiene:  poor  HISTORY:  Onset: Since childhood, but progressively gotten worse since 2018 Location:  Bi-frontal and top of head Quality:  Pounding, but she gets electric shocks when she hears noise Initial intensity:  Mild progressing to severe.  She denies new headache, thunderclap headache.  She wakes up with headaches. Aura:  no Prodrome:  no Postdrome:  no Associated symptoms: Nausea, phonophobia, blurred vision, lethargy.  She denies associated vomiting, photophobia, osmophobia or unilateral numbness or weakness. Initial duration:  3 to 4  days Initial Frequency:  4 times a month Initial Frequency of abortive medication: BC powder daily Triggers: No Exacerbating factors:  sounds Relieving factors:  nothing Activity:  aggravates  CT Head without contrast from 05/21/17 personally reviewed and was unremarkable.  Past NSAIDS:  Ibuprofen, naproxen  Past analgesics:  Excedrin migraine, tension headache, Tylenol, BC Past abortive triptans:  no Past abortive ergotamine:  no Past muscle relaxants:  no Past anti-emetic:  no Past antihypertensive medications:  no Past antidepressant medications:  fluoxetine Past anticonvulsant medications:  no Past anti-CGRP:  no Past vitamins/Herbal/Supplements:  no Past antihistamines/decongestants:  no Other past therapies:  no  Family history of headache:  Mom  PAST MEDICAL HISTORY: Past Medical History:  Diagnosis Date  . Depression   . Headache     MEDICATIONS: Current Outpatient Medications on File Prior to Visit  Medication Sig Dispense Refill  . ketoconazole (NIZORAL) 2 % shampoo Apply to back and leave on for 5 minutes, once a day for 1 week, then three times a week until resolved 120 mL 0  . naproxen (NAPROSYN) 500 MG tablet Take 1 tablet (500 mg total) by mouth 2 (two) times daily. 30 tablet 0  . nortriptyline (PAMELOR) 25 MG capsule Take 1 capsule (25 mg total) by mouth at bedtime. 30 capsule 3  . nortriptyline (PAMELOR) 50 MG capsule Take 1 capsule (50 mg total) by mouth at bedtime. 30 capsule 3  . ondansetron (ZOFRAN ODT) 4 MG disintegrating tablet Take 1 tablet (4 mg total) by mouth every 8 (eight) hours as needed for nausea or vomiting. 20 tablet 0  . SUMAtriptan (IMITREX) 100 MG tablet Take 1 tablet earliest onset of headache.  May repeat once in 2 hours if  headache persists or recurs.  Do not exceed 2 tablets in 24 hours 10 tablet 3   No current facility-administered medications on file prior to visit.     ALLERGIES: No Known Allergies  FAMILY HISTORY: Family  History  Problem Relation Age of Onset  . Hypertension Mother   . Heart defect Brother   . Asthma Brother     SOCIAL HISTORY: Social History   Socioeconomic History  . Marital status: Single    Spouse name: Not on file  . Number of children: Not on file  . Years of education: Not on file  . Highest education level: Not on file  Occupational History  . Not on file  Social Needs  . Financial resource strain: Not on file  . Food insecurity:    Worry: Not on file    Inability: Not on file  . Transportation needs:    Medical: Not on file    Non-medical: Not on file  Tobacco Use  . Smoking status: Former Smoker    Last attempt to quit: 12/15/2017    Years since quitting: 0.5  . Smokeless tobacco: Never Used  Substance and Sexual Activity  . Alcohol use: No  . Drug use: No  . Sexual activity: Never    Birth control/protection: None  Lifestyle  . Physical activity:    Days per week: Not on file    Minutes per session: Not on file  . Stress: Not on file  Relationships  . Social connections:    Talks on phone: Not on file    Gets together: Not on file    Attends religious service: Not on file    Active member of club or organization: Not on file    Attends meetings of clubs or organizations: Not on file    Relationship status: Not on file  . Intimate partner violence:    Fear of current or ex partner: Not on file    Emotionally abused: Not on file    Physically abused: Not on file    Forced sexual activity: Not on file  Other Topics Concern  . Not on file  Social History Narrative  . Not on file    REVIEW OF SYSTEMS: Constitutional: No fevers, chills, or sweats, no generalized fatigue, change in appetite Eyes: No visual changes, double vision, eye pain Ear, nose and throat: No hearing loss, ear pain, nasal congestion, sore throat Cardiovascular: No chest pain, palpitations Respiratory:  No shortness of breath at rest or with exertion, wheezes GastrointestinaI:  No nausea, vomiting, diarrhea, abdominal pain, fecal incontinence Genitourinary:  No dysuria, urinary retention or frequency Musculoskeletal:  No neck pain, back pain Integumentary: No rash, pruritus, skin lesions Neurological: as above Psychiatric: anxiety Endocrine: hyperhidrosis Hematologic/Lymphatic:  No purpura, petechiae. Allergic/Immunologic: no itchy/runny eyes, nasal congestion, recent allergic reactions, rashes  PHYSICAL EXAM: Blood pressure 118/62, pulse 78, height 5\' 2"  (1.575 m), weight 108 lb (49 kg), SpO2 97 %. General: Anxious.  No acute distress.  Patient appears well-groomed.  normal body habitus. Head:  Normocephalic/atraumatic Eyes:  Fundi examined but not visualized Neck: supple, no paraspinal tenderness, full range of motion Heart:  Regular rate and rhythm Lungs:  Clear to auscultation bilaterally Back: No paraspinal tenderness Neurological Exam: alert and oriented to person, place, and time. Attention span and concentration intact, recent and remote memory intact, fund of knowledge intact.  Speech fluent and not dysarthric, language intact.  CN II-XII intact. Bulk and tone normal, tremulousness involving hands and sometimes  head, muscle strength 5/5 throughout.  Sensation to light touch intact.  Deep tendon reflexes 2+ throughout, toes downgoing.  Finger to nose testing intact.  Gait normal, Romberg negative.  IMPRESSION: Migraine without aura, without status migrainosus, intractable Anxiety Hyperhidrosis/tremulousness.  Uncertain if there is an underlying endocrine disorder.  Anxiety may be playing a role.  PLAN: 1.  She will have the MRI performed.  Valium 5mg  provided for claustrophobia.  Her uncle will drive her. 2.  Increase nortriptyline to 50mg  at bedtime.  We can increase dose to 100mg  at bedtime in 6 weeks if needed. 3.  Stop sumatriptan.  For abortive therapy, try rizatriptan 10mg  4. Limit use of pain relievers to no more than 2 days out of week to  prevent risk of rebound or medication-overuse headache. 5.  Will check TSH to evaluate hyperhidrosis and tremulousness.  However, she will need to establish care with a PCP for further testing. 6.  Follow up in 3 to 4 months.  Shon Millet, DO

## 2018-06-28 ENCOUNTER — Ambulatory Visit (INDEPENDENT_AMBULATORY_CARE_PROVIDER_SITE_OTHER): Payer: Medicaid Other | Admitting: Neurology

## 2018-06-28 ENCOUNTER — Encounter: Payer: Self-pay | Admitting: Neurology

## 2018-06-28 ENCOUNTER — Other Ambulatory Visit (INDEPENDENT_AMBULATORY_CARE_PROVIDER_SITE_OTHER): Payer: Medicaid Other

## 2018-06-28 VITALS — BP 118/62 | HR 78 | Ht 62.0 in | Wt 108.0 lb

## 2018-06-28 DIAGNOSIS — F419 Anxiety disorder, unspecified: Secondary | ICD-10-CM

## 2018-06-28 DIAGNOSIS — R61 Generalized hyperhidrosis: Secondary | ICD-10-CM

## 2018-06-28 DIAGNOSIS — R251 Tremor, unspecified: Secondary | ICD-10-CM

## 2018-06-28 DIAGNOSIS — R6889 Other general symptoms and signs: Secondary | ICD-10-CM

## 2018-06-28 DIAGNOSIS — G43019 Migraine without aura, intractable, without status migrainosus: Secondary | ICD-10-CM | POA: Diagnosis not present

## 2018-06-28 LAB — TSH: TSH: 1.39 u[IU]/mL (ref 0.35–4.50)

## 2018-06-28 MED ORDER — DIAZEPAM 5 MG PO TABS: ORAL_TABLET | ORAL | 0 refills | Status: DC

## 2018-06-28 MED ORDER — RIZATRIPTAN BENZOATE 10 MG PO TABS: ORAL_TABLET | ORAL | 3 refills | Status: AC

## 2018-06-28 NOTE — Patient Instructions (Addendum)
1.  Increase nortriptyline to 50mg  at bedtime.  Contact me in 6 weeks with update and we can increase dose if needed. 2.  Stop sumatriptan.  Instead, take rizatriptan 10mg  at earliest onset of migraine.  May repeat once after 2 hours if needed, no more than 2 tablets in 24 hours. 3.  Limit use of pain relievers to no more than 2 days out of week to prevent risk of rebound or medication-overuse headache. 4.  Keep headache diary 5.  Proceed with MRI scheduled.  6.  Check TSH  Your provider has requested that you have labwork completed today. Please go to [facility] ([id number]) on the second floor of this building before leaving the office today. You do not need to check in. If you are not called within 15 minutes please check with the front desk.

## 2018-07-03 ENCOUNTER — Telehealth: Payer: Self-pay

## 2018-07-03 NOTE — Telephone Encounter (Signed)
Called and spoke with Cedric, advised thyroid labs are normal

## 2018-07-03 NOTE — Telephone Encounter (Signed)
-----   Message from Drema DallasAdam R Jaffe, DO sent at 06/28/2018  4:55 PM EST ----- Thyroid test is normal

## 2018-07-04 ENCOUNTER — Emergency Department (HOSPITAL_COMMUNITY): Payer: No Typology Code available for payment source

## 2018-07-04 ENCOUNTER — Encounter (HOSPITAL_COMMUNITY): Payer: Self-pay

## 2018-07-04 ENCOUNTER — Emergency Department (HOSPITAL_COMMUNITY)
Admission: EM | Admit: 2018-07-04 | Discharge: 2018-07-04 | Disposition: A | Discharge status: Home / Self Care | Payer: No Typology Code available for payment source | Attending: Emergency Medicine | Admitting: Emergency Medicine

## 2018-07-04 ENCOUNTER — Ambulatory Visit (HOSPITAL_COMMUNITY): Admission: RE | Admit: 2018-07-04 | Payer: Medicaid Other | Source: Ambulatory Visit

## 2018-07-04 ENCOUNTER — Other Ambulatory Visit: Payer: Self-pay

## 2018-07-04 DIAGNOSIS — S0990XA Unspecified injury of head, initial encounter: Secondary | ICD-10-CM

## 2018-07-04 DIAGNOSIS — S161XXA Strain of muscle, fascia and tendon at neck level, initial encounter: Secondary | ICD-10-CM

## 2018-07-04 DIAGNOSIS — Y929 Unspecified place or not applicable: Secondary | ICD-10-CM | POA: Insufficient documentation

## 2018-07-04 DIAGNOSIS — Y939 Activity, unspecified: Secondary | ICD-10-CM | POA: Diagnosis not present

## 2018-07-04 DIAGNOSIS — Z87891 Personal history of nicotine dependence: Secondary | ICD-10-CM | POA: Insufficient documentation

## 2018-07-04 DIAGNOSIS — Y999 Unspecified external cause status: Secondary | ICD-10-CM | POA: Insufficient documentation

## 2018-07-04 DIAGNOSIS — Z79899 Other long term (current) drug therapy: Secondary | ICD-10-CM | POA: Diagnosis not present

## 2018-07-04 DIAGNOSIS — S199XXA Unspecified injury of neck, initial encounter: Secondary | ICD-10-CM | POA: Diagnosis present

## 2018-07-04 DIAGNOSIS — R51 Headache: Secondary | ICD-10-CM | POA: Insufficient documentation

## 2018-07-04 HISTORY — DX: Anxiety disorder, unspecified: F41.9

## 2018-07-04 HISTORY — DX: Schizoaffective disorder, unspecified: F25.9

## 2018-07-04 MED ORDER — METHOCARBAMOL 500 MG PO TABS: 500.0000 mg | ORAL_TABLET | Freq: Two times a day (BID) | ORAL | 0 refills | Status: DC
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
## 2018-07-04 NOTE — ED Notes (Signed)
Patient transported to MRI 
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
## 2018-07-04 NOTE — ED Notes (Signed)
Pt took home diazepam 5mg  for claustriphobia .

## 2018-07-25 DIAGNOSIS — F4310 Post-traumatic stress disorder, unspecified: Secondary | ICD-10-CM

## 2018-07-25 HISTORY — DX: Post-traumatic stress disorder, unspecified: F43.10

## 2018-07-31 ENCOUNTER — Ambulatory Visit: Payer: Self-pay | Admitting: Neurology
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
## 2018-10-24 ENCOUNTER — Telehealth: Payer: Self-pay | Admitting: Neurology

## 2018-10-24 ENCOUNTER — Other Ambulatory Visit: Payer: Self-pay

## 2018-10-24 ENCOUNTER — Telehealth (INDEPENDENT_AMBULATORY_CARE_PROVIDER_SITE_OTHER): Payer: Medicaid Other | Admitting: Neurology

## 2018-10-24 ENCOUNTER — Encounter: Payer: Self-pay | Admitting: Neurology

## 2018-10-24 VITALS — HR 72

## 2018-10-24 DIAGNOSIS — G43009 Migraine without aura, not intractable, without status migrainosus: Secondary | ICD-10-CM

## 2018-10-24 DIAGNOSIS — G8929 Other chronic pain: Secondary | ICD-10-CM

## 2018-10-24 DIAGNOSIS — R51 Headache: Secondary | ICD-10-CM

## 2018-10-24 MED ORDER — NORTRIPTYLINE HCL 75 MG PO CAPS: 75.0000 mg | ORAL_CAPSULE | Freq: Every day | ORAL | 3 refills | Status: DC

## 2018-10-24 NOTE — Telephone Encounter (Signed)
Patient states that Everlena Cooper called in her medication today (wasn't sure what it is ) and the [facility] on [address] does not have anything from our office. Please resend it and pt wants a call to let her know when it has been done

## 2018-10-24 NOTE — Telephone Encounter (Signed)
Rx was sent in at 11:17 and pharmacy confirmed receipt. [facility], spoke with [place]. They did receive the Rx, however it has to be ordered and it will be there tomorrow. I asked that she contact the Pt with tthat information, she agreed to do so.

## 2018-10-24 NOTE — Patient Instructions (Signed)
  1.  We will increase nortriptyline to 75mg  at bedtime to try and reduce frequency of these zapping headaches.  We can increase to 100mg  at bedtime in 4 weeks if needed. 2.  For abortive therapy, sumatriptan 100mg  3.  Limit use of pain relievers to no more than 2 days out of week to prevent risk of rebound or medication-overuse headache. 4.  Keep headache diary 5.  Exercise, hydration, caffeine cessation, sleep hygiene, monitor for and avoid triggers 6.  Consider:  magnesium citrate 400mg  daily, riboflavin 400mg  daily, and coenzyme Q10 100mg  three times daily 7. Advised to establish care with a PCP  8. Follow up in 4 months.

## 2018-10-31 ENCOUNTER — Ambulatory Visit: Payer: Self-pay | Admitting: Neurology

## 2018-10-31 ENCOUNTER — Encounter
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
## 2019-02-26 ENCOUNTER — Ambulatory Visit: Payer: Medicaid Other | Admitting: Neurology

## 2019-05-20 NOTE — Progress Notes (Signed)
Virtual Visit via Video Note The purpose of this virtual visit is to provide medical care while limiting exposure to the novel coronavirus.    Consent was obtained for video visit:  Yes.   Answered questions that patient had about telehealth interaction:  Yes.   I discussed the limitations, risks, security and privacy concerns of performing an evaluation and management service by telemedicine. I also discussed with the patient that there may be a patient responsible charge related to this service. The patient expressed understanding and agreed to proceed.  Pt location: Home Physician Location: office Name of referring provider:  No ref. provider found I connected with Shelby Shelton at patients initiation/request on 05/21/2019 at  3:30 PM EDT by video enabled telemedicine application and verified that I am speaking with the correct person using two identifiers. Pt MRN:  [id number] Pt DOB:  01/24/1991 Video Participants:  Shelby HaffMoneisha Shelton;   History of Present Illness:   Shelby GaussMoneisha Hawkinsis a 28 year old right-handed female with hyperhidrosis since childhood who follows up for headaches.She is accompanied by her uncle who supplements history.  UPDATE: Last visit, an MRI of brain was ordered.    In April, she had increased daily electric shock head pain.  Nortriptyline was increased to 75mg  at bedtime and they resolved.  She stopped the nortriptyline 2 weeks ago but they returned about a week ago.  Migraines have returned as well.  Frequency of abortive medication:as needed Current NSAIDS:none Current analgesics:acetaminophen Current triptans:none Current ergotamine:none Current anti-emetic:none Current muscle relaxants:none Current anti-anxiolytic:none Current sleep aide:none Current Antihypertensive medications:none Current Antidepressant medications:Nortriptyline50mg  at bedtime Current Anticonvulsant medications:none Current anti-CGRP:none  Current Vitamins/Herbal/Supplements:none Current Antihistamines/Decongestants:none Other therapy:none Other medication:none  Given hyperhidrosis and tremulousness, TSH was checked on 06/28/18, which was 1.39.  Caffeine:no Alcohol:no Smoker:no Diet:hydrates Exercise:no Depression:no; Anxiety:yes Other pain:Back pain Sleep hygiene:poor  HISTORY: Onset:  Since childhood, but progressively gotten worse since 2018 Location:Bi-frontal and top of head Quality:Pounding, but she gets electric shocks when she hears noise Initial intensity:Mild progressing to severe.Shedenies new headache, thunderclap headache. She wakes up with headaches. Aura:no Prodrome:no Postdrome:no Associated symptoms: Nausea, phonophobia, blurred vision, lethargy. She denies associated vomiting, photophobia, osmophobiaor unilateral numbness or weakness. Initial duration:3 to 4 days InitialFrequency:4 times a month InitialFrequency of abortive medication:BC powder daily Triggers: No Exacerbating factors:sounds Relieving factors:nothing Activity:aggravates  MRI of brain without contrast on 12/11/19was unremarkable.  Past NSAIDS:Ibuprofen, naproxen Past analgesics:Excedrin migraine, tension headache, Tylenol, BC Past abortive triptans:sumatriptan, rizatriptan (both ineffective) Past abortive ergotamine:no Past muscle relaxants:no Past anti-emetic:no Past antihypertensive medications:no Past antidepressant medications: fluoxetine Past anticonvulsant medications:no Past anti-CGRP:no Past vitamins/Herbal/Supplements:no Past antihistamines/decongestants:no Other past therapies:no  Family history of headache:Mom   Past Medical History: Past Medical History:  Diagnosis Date  . Anxiety   . Depression   . Headache   . Schizoaffective disorder (HCC)     Medications: Outpatient Encounter Medications as of 05/21/2019   Medication Sig  . diazepam (VALIUM) 5 MG tablet Take 45 minutes prior to MRI.  Marland Kitchen. ketoconazole (NIZORAL) 2 % shampoo Apply to back and leave on for 5 minutes, once a day for 1 week, then three times a week until resolved  . methocarbamol (ROBAXIN) 500 MG tablet Take 1 tablet (500 mg total) by mouth 2 (two) times daily.  . naproxen (NAPROSYN) 500 MG tablet Take 1 tablet (500 mg total) by mouth 2 (two) times daily.  . ondansetron (ZOFRAN ODT) 4 MG disintegrating tablet Take 1 tablet (4 mg total) by mouth every 8 (eight) hours as needed  for nausea or vomiting.  . rizatriptan (MAXALT) 10 MG tablet Take 1 tablet earliest onset of migraine.  May repeat x1 in 2 hours if needed.  Max 2 tablets in 24h  . SUMAtriptan (IMITREX) 100 MG tablet Take 1 tablet earliest onset of headache.  May repeat once in 2 hours if headache persists or recurs.  Do not exceed 2 tablets in 24 hours  . [DISCONTINUED] nortriptyline (PAMELOR) 75 MG capsule Take 1 capsule (75 mg total) by mouth at bedtime.  . nortriptyline (PAMELOR) 25 MG capsule Take 1 capsule at bedtime for 7 days, then 2 capsules at bedtime for 7 days, then 3 capsules at bedtime.  Contact office for refills.   No facility-administered encounter medications on file as of 05/21/2019.     Allergies: No Known Allergies  Family History: Family History  Problem Relation Age of Onset  . Hypertension Mother   . Heart defect Brother   . Asthma Brother     Social History: Social History   Socioeconomic History  . Marital status: Single    Spouse name: Not on file  . Number of children: 0  . Years of education: Not on file  . Highest education level: High school graduate  Occupational History  . Not on file  Social Needs  . Financial resource strain: Not on file  . Food insecurity    Worry: Not on file    Inability: Not on file  . Transportation needs    Medical: Not on file    Non-medical: Not on file  Tobacco Use  . Smoking status: Former Smoker     Quit date: 12/15/2017    Years since quitting: 1.4  . Smokeless tobacco: Never Used  Substance and Sexual Activity  . Alcohol use: No  . Drug use: No  . Sexual activity: Never    Birth control/protection: None  Lifestyle  . Physical activity    Days per week: Not on file    Minutes per session: Not on file  . Stress: Not on file  Relationships  . Social Herbalist on phone: Not on file    Gets together: Not on file    Attends religious service: Not on file    Active member of club or organization: Not on file    Attends meetings of clubs or organizations: Not on file    Relationship status: Not on file  . Intimate partner violence    Fear of current or ex partner: Not on file    Emotionally abused: Not on file    Physically abused: Not on file    Forced sexual activity: Not on file  Other Topics Concern  . Not on file  Social History Narrative   Pt lives with family in apartment   She has no children   Right handed   She does not exercise   She does not drink coffee, tea nor soda    Observations/Objective:   Height 5\' 3"  (1.6 m), weight 110 lb (49.9 kg). No acute distress.  Alert and oriented.  Speech fluent and not dysarthric.  Language intact.  Eyes orthophoric on primary gaze.  Face symmetric.  Assessment and Plan:   1.  Migraine without aura, without status migrainosus, not intractable 2.  Electric-shock like headaches. Aggravated by cessation of nortriptyline.  1.  Restart nortriptyline 25mg  at bedtime for 1 week, then 50mg  at bedtime for 1 week, then 75mg  at bedtime. 2.  Acetaminophen for abortive headache therapy.  3.  Limit use of pain relievers to no more than 2 days out of week to prevent risk of rebound or medication-overuse headache. 4.  Keep headache diary 5.  Follow up in 4 months.  Follow Up Instructions:    -I discussed the assessment and treatment plan with the patient. The patient was provided an opportunity to ask questions and all  were answered. The patient agreed with the plan and demonstrated an understanding of the instructions.   The patient was advised to call back or seek an in-person evaluation if the symptoms worsen or if the condition fails to improve as anticipated.   Cira Servant, DO

## 2019-05-21 ENCOUNTER — Encounter: Payer: Self-pay | Admitting: Neurology

## 2019-05-21 ENCOUNTER — Other Ambulatory Visit: Payer: Self-pay

## 2019-05-21 ENCOUNTER — Telehealth (INDEPENDENT_AMBULATORY_CARE_PROVIDER_SITE_OTHER): Payer: Medicaid Other | Admitting: Neurology

## 2019-05-21 VITALS — Ht 63.0 in | Wt 110.0 lb

## 2019-05-21 DIAGNOSIS — G43009 Migraine without aura, not intractable, without status migrainosus: Secondary | ICD-10-CM | POA: Diagnosis not present

## 2019-05-21 DIAGNOSIS — R52 Pain, unspecified: Secondary | ICD-10-CM

## 2019-05-21 MED ORDER — NORTRIPTYLINE HCL 25 MG PO CAPS: ORAL_CAPSULE | ORAL | 0 refills | Status: DC

## 2019-07-13 ENCOUNTER — Other Ambulatory Visit: Payer: Self-pay | Admitting: Neurology

## 2019-09-26 ENCOUNTER — Encounter (HOSPITAL_COMMUNITY): Payer: Self-pay

## 2019-09-26 ENCOUNTER — Ambulatory Visit (HOSPITAL_COMMUNITY)
Admission: EM | Admit: 2019-09-26 | Discharge: 2019-09-26 | Disposition: A | Discharge status: Home / Self Care | Payer: Medicaid Other | Attending: Family Medicine | Admitting: Family Medicine

## 2019-09-26 ENCOUNTER — Encounter: Payer: Self-pay | Admitting: Neurology

## 2019-09-26 ENCOUNTER — Other Ambulatory Visit: Payer: Self-pay

## 2019-09-26 DIAGNOSIS — R110 Nausea: Secondary | ICD-10-CM

## 2019-09-26 DIAGNOSIS — L309 Dermatitis, unspecified: Secondary | ICD-10-CM | POA: Insufficient documentation

## 2019-09-26 DIAGNOSIS — Z202 Contact with and (suspected) exposure to infections with a predominantly sexual mode of transmission: Secondary | ICD-10-CM | POA: Insufficient documentation

## 2019-09-26 DIAGNOSIS — N898 Other specified noninflammatory disorders of vagina: Secondary | ICD-10-CM | POA: Diagnosis present

## 2019-09-26 DIAGNOSIS — Z3202 Encounter for pregnancy test, result negative: Secondary | ICD-10-CM | POA: Diagnosis not present

## 2019-09-26 LAB — POCT PREGNANCY, URINE: Preg Test, Ur: NEGATIVE

## 2019-09-26 LAB — POC URINE PREG, ED: Preg Test, Ur: NEGATIVE

## 2019-09-26 MED ORDER — TRIAMCINOLONE ACETONIDE 0.1 % EX CREA: 1.0000 "application " | TOPICAL_CREAM | Freq: Two times a day (BID) | CUTANEOUS | 0 refills | Status: DC | PRN

## 2019-09-26 MED ORDER — METRONIDAZOLE 500 MG PO TABS: 500.0000 mg | ORAL_TABLET | Freq: Two times a day (BID) | ORAL | 0 refills | Status: AC

## 2019-09-26 MED ORDER — ONDANSETRON HCL 8 MG PO TABS: 8.0000 mg | ORAL_TABLET | Freq: Three times a day (TID) | ORAL | 0 refills | Status: DC | PRN

## 2019-09-26 NOTE — Discharge Instructions (Signed)
Recommend start Zofran 8mg  every 8 hours as needed for nausea- make sure you take 1 before you take the antibiotic. Start Flagyl (antibiotic) 500mg  twice a day for 7 days- take with food and after Zofran. No sexual intercourse for at least 7 days. Recommend use condoms with each and every future sexual encounter. May use Triamcinolone cream twice a day- apply to rash as needed. Follow-up pending lab results and with your Primary Care Provider as needed.

## 2019-09-26 NOTE — Progress Notes (Signed)
Due to the COVID-19 crisis, this virtual check-in visit was done via telephone from my office and it was initiated and consent given by this patient and or family.   Telephone (Audio) Visit The purpose of this telephone visit is to provide medical care while limiting exposure to the novel coronavirus.    Consent was obtained for telephone visit and initiated by pt/family:  Yes.   Answered questions that patient had about telehealth interaction:  Yes.   I discussed the limitations, risks, security and privacy concerns of performing an evaluation and management service by telephone. I also discussed with the patient that there may be a patient responsible charge related to this service. The patient expressed understanding and agreed to proceed.  Pt location: Home Physician Location: office Name of referring provider:  No ref. provider found I connected with .Shelby Shelton at patients initiation/request on 09/27/2019 at 10:50 AM EST by telephone and verified that I am speaking with the correct person using two identifiers.  Pt MRN:  [id number] Pt DOB:  04/21/1991  History of Present Illness:  Shelby Shelton a 29 year old right-handed female with hyperhidrosis since childhood who follows up for headaches.  UPDATE: After restarting nortriptyline, she was averaging a headache once a day, but lating all day.  However, she has only been taking nortriptyline as needed.  She says it makes her nauseous and gives her dry mouth.  Over the past 2 weeks, the headaches have been daily.   Current NSAIDS:none Current analgesics:acetaminophen Current triptans:none Current ergotamine:none Current anti-emetic:none Current muscle relaxants:none Current anti-anxiolytic:none Current sleep aide:none Current Antihypertensive medications:none Current Antidepressant medications:Nortriptyline75mg  at bedtime Current Anticonvulsant medications:none Current  anti-CGRP:none Current Vitamins/Herbal/Supplements:none Current Antihistamines/Decongestants:none Other therapy:none Other medication:none    Caffeine:no Alcohol:no Smoker:no Diet:hydrates Exercise:no Depression:no; Anxiety:yes Other pain:Back pain Sleep hygiene:poor  HISTORY: Onset:  Since childhood, but progressively gotten worse since 2018 Location:Bi-frontal and top of head Quality:Pounding, but she gets electric shocks when she hears noise Initial intensity:Mild progressing to severe.Shedenies new headache, thunderclap headache. She wakes up with headaches. Aura:no Prodrome:no Postdrome:no Associated symptoms: Nausea, phonophobia, blurred vision, lethargy. She denies associated vomiting, photophobia, osmophobiaor unilateral numbness or weakness. Initial duration:3 to 4 days InitialFrequency:4 times a month InitialFrequency of abortive medication:BC powder daily Triggers: No Exacerbating factors:sounds Relieving factors:nothing Activity:aggravates  MRI of brain without contrast on 07/04/18 was unremarkable.  Past NSAIDS:Ibuprofen, naproxen Past analgesics:Excedrin migraine, tension headache, Tylenol, BC Past abortive triptans:sumatriptan, rizatriptan (both ineffective) Past abortive ergotamine:no Past muscle relaxants:no Past anti-emetic:no Past antihypertensive medications:no Past antidepressant medications: fluoxetine Past anticonvulsant medications:no Past anti-CGRP:no Past vitamins/Herbal/Supplements:no Past antihistamines/decongestants:no Other past therapies:no  Family history of headache:Mom  Given hyperhidrosis and tremulousness, TSH was checked on 06/28/18, which was 1.39.  Past Medical History: Past Medical History:  Diagnosis Date  . Anxiety   . Depression   . Headache   . Schizoaffective disorder ([place])     Medications: Outpatient Encounter  Medications as of 09/27/2019  Medication Sig  . diazepam (VALIUM) 5 MG tablet Take 45 minutes prior to MRI.  Marland Kitchen ketoconazole (NIZORAL) 2 % shampoo Apply to back and leave on for 5 minutes, once a day for 1 week, then three times a week until resolved  . methocarbamol (ROBAXIN) 500 MG tablet Take 1 tablet (500 mg total) by mouth 2 (two) times daily.  . naproxen (NAPROSYN) 500 MG tablet Take 1 tablet (500 mg total) by mouth 2 (two) times daily.  . nortriptyline (PAMELOR) 25 MG capsule TAKE 1 CAPSULE AT BEDTIME FOR 7 DAYS,  THEN 2 CAPSULES AT BEDTIME FOR 7 DAYS, THEN 3 CAPSULES AT BEDTIME. CONTACT OFFICE FOR REFILLS.  Marland Kitchen ondansetron (ZOFRAN ODT) 4 MG disintegrating tablet Take 1 tablet (4 mg total) by mouth every 8 (eight) hours as needed for nausea or vomiting.  . rizatriptan (MAXALT) 10 MG tablet Take 1 tablet earliest onset of migraine.  May repeat x1 in 2 hours if needed.  Max 2 tablets in 24h  . SUMAtriptan (IMITREX) 100 MG tablet Take 1 tablet earliest onset of headache.  May repeat once in 2 hours if headache persists or recurs.  Do not exceed 2 tablets in 24 hours   No facility-administered encounter medications on file as of 09/27/2019.    Allergies: No Known Allergies  Family History: Family History  Problem Relation Age of Onset  . Hypertension Mother   . Heart defect Brother   . Asthma Brother     Social History: Social History   Socioeconomic History  . Marital status: Single    Spouse name: Not on file  . Number of children: 0  . Years of education: Not on file  . Highest education level: High school graduate  Occupational History  . Not on file  Tobacco Use  . Smoking status: Former Smoker    Quit date: 12/15/2017    Years since quitting: 1.7  . Smokeless tobacco: Never Used  Substance and Sexual Activity  . Alcohol use: No  . Drug use: No  . Sexual activity: Never    Birth control/protection: None  Other Topics Concern  . Not on file  Social History Narrative    Pt lives with family in apartment   She has no children   Right handed   She does not exercise   She does not drink coffee, tea nor soda   Social Determinants of Health   Financial Resource Strain:   . Difficulty of Paying Living Expenses: Not on file  Food Insecurity:   . Worried About Programme researcher, broadcasting/film/video in the Last Year: Not on file  . Ran Out of Food in the Last Year: Not on file  Transportation Needs:   . Lack of Transportation (Medical): Not on file  . Lack of Transportation (Non-Medical): Not on file  Physical Activity:   . Days of Exercise per Week: Not on file  . Minutes of Exercise per Session: Not on file  Stress:   . Feeling of Stress : Not on file  Social Connections:   . Frequency of Communication with Friends and Family: Not on file  . Frequency of Social Gatherings with Friends and Family: Not on file  . Attends Religious Services: Not on file  . Active Member of Clubs or Organizations: Not on file  . Attends Banker Meetings: Not on file  . Marital Status: Not on file  Intimate Partner Violence:   . Fear of Current or Ex-Partner: Not on file  . Emotionally Abused: Not on file  . Physically Abused: Not on file  . Sexually Abused: Not on file    Observations/Objective:   Height 5\' 3"  (1.6 m), weight 125 lb (56.7 kg), last menstrual period 09/24/2019.   Assessment and Plan:   1.  Migraine without aura, without status migrainosus, not intractable  1.  For preventative management, will start topiramate 25mg  at bedtime (discussed side effects such as paresthesias, weight loss) and advised to take precautions not to get pregnant due to teratogenic effects.  We can increase to 50mg  at bedtime  in 4 weeks if needed. 2.  For abortive therapy, Ubrelvy 100mg  3.  Limit use of pain relievers to no more than 2 days out of week to prevent risk of rebound or medication-overuse headache. 4.  Keep headache diary 5.  Exercise, hydration, caffeine cessation,  sleep hygiene, monitor for and avoid triggers 6. Follow up 4 months.   Follow Up Instructions:    -I discussed the assessment and treatment plan with the patient. The patient was provided an opportunity to ask questions and all were answered. The patient agreed with the plan and demonstrated an understanding of the instructions.   The patient was advised to call back or seek an in-person evaluation if the symptoms worsen or if the condition fails to improve as anticipated.    Total Time spent in visit with the patient was:  11 minutes  , DO

## 2019-09-26 NOTE — ED Triage Notes (Signed)
Pt state she has gotten a call from her partner stating he needs to be tested for a STD.

## 2019-09-26 NOTE — ED Provider Notes (Signed)
[facility]    CSN: [id number] Arrival date & time: 09/26/19  1724      History   Chief Complaint Chief Complaint  Patient presents with  . SEXUALLY TRANSMITTED DISEASE    HPI Shelby Shelton is a 29 y.o. female.   29 year old female presents with multiple concerns. First concern is exposure to STD. Her partner notified her today that he was positive to possible Trichomoniasis. She has had only 1 partner in the past 3 months and does not use any protection or contraception. She denies any fever, abdominal pain, back pain, or current dysuria. She has been nauseous, had decreased appetite and has had vaginal discharge for "awhile". Her last period started 09/18/2019 and ended 2 days ago and was normal per patient. She recently was diagnosed with a UTI and took 5 days of Bactrim in which she finished her last dose last night. Her other concern is that she noticed a "rash" on her right and left upper arms near her axilla today as well as some bumps on the upper part of both thighs. Slightly itchy. Has not applied any medication to areas. No previous history of reaction or rash to Bactrim or sulfa medications. Other chronic health issues include anxiety, PTSD and headaches and currently on Nortriptyline daily and Hydroxyzine 3 times a day. She has an appointment with her Neurologist tomorrow. She is very upset with her partner and has multiple questions regarding antibiotics/treatments. She also wants to take a medication to "gain weight" even though she is not underweight and is asking who to talk to about further evaluation of this concern.   The history is provided by the patient.    Past Medical History:  Diagnosis Date  . Anxiety   . Depression   . Headache   . Schizoaffective disorder ([facility])     There are no problems to display for this patient.   Past Surgical History:  Procedure Laterality Date  . FINGER SURGERY  2015    OB History   No obstetric history on  file.      Home Medications    Prior to Admission medications   Medication Sig Start Date End Date Taking? Authorizing Provider  hydrOXYzine (ATARAX/VISTARIL) 25 MG tablet Take 25 mg by mouth 3 (three) times daily as needed for anxiety.   Yes [provider]  ketoconazole (NIZORAL) 2 % shampoo Apply to back and leave on for 5 minutes, once a day for 1 week, then three times a week until resolved 10/19/17   Linus Mako B, NP  metroNIDAZOLE (FLAGYL) 500 MG tablet Take 1 tablet (500 mg total) by mouth 2 (two) times daily for 7 days. Take with food. 09/26/19 10/03/19  Sudie Grumbling, NP  nortriptyline (PAMELOR) 25 MG capsule TAKE 1 CAPSULE AT BEDTIME FOR 7 DAYS, THEN 2 CAPSULES AT BEDTIME FOR 7 DAYS, THEN 3 CAPSULES AT BEDTIME. CONTACT OFFICE FOR REFILLS. 07/15/19   Everlena Cooper, Adam R, DO  ondansetron (ZOFRAN) 8 MG tablet Take 1 tablet (8 mg total) by mouth every 8 (eight) hours as needed for nausea. 09/26/19   Sudie Grumbling, NP  rizatriptan (MAXALT) 10 MG tablet Take 1 tablet earliest onset of migraine.  May repeat x1 in 2 hours if needed.  Max 2 tablets in 24h 06/28/18   Everlena Cooper, Adam R, DO  SUMAtriptan (IMITREX) 100 MG tablet Take 1 tablet earliest onset of headache.  May repeat once in 2 hours if headache persists or recurs.  Do  not exceed 2 tablets in 24 hours 02/14/18   Metta Clines R, DO  triamcinolone cream (KENALOG) 0.1 % Apply 1 application topically 2 (two) times daily as needed. To rash 09/26/19   Katy Apo, NP    Family History Family History  Problem Relation Age of Onset  . Hypertension Mother   . Heart defect Brother   . Asthma Brother     Social History Social History   Tobacco Use  . Smoking status: Former Smoker    Quit date: 12/15/2017    Years since quitting: 1.7  . Smokeless tobacco: Never Used  Substance Use Topics  . Alcohol use: No  . Drug use: No     Allergies   Patient has no known allergies.   Review of Systems Review of Systems   Constitutional: Positive for appetite change. Negative for chills, fatigue and fever.  HENT: Negative for mouth sores and sore throat.   Gastrointestinal: Positive for nausea. Negative for abdominal pain, diarrhea and vomiting.  Genitourinary: Positive for vaginal discharge. Negative for difficulty urinating, dysuria, flank pain, frequency, genital sores, hematuria, menstrual problem, pelvic pain, urgency and vaginal bleeding.  Musculoskeletal: Negative for arthralgias, back pain and myalgias.  Skin: Positive for rash. Negative for wound.  Allergic/Immunologic: Negative for environmental allergies, food allergies and immunocompromised state.  Neurological: Negative for dizziness, seizures, syncope, weakness, light-headedness, numbness and headaches.  Hematological: Negative for adenopathy. Does not bruise/bleed easily.  Psychiatric/Behavioral: Negative for self-injury and suicidal ideas. The patient is nervous/anxious.      Physical Exam Triage Vital Signs ED Triage Vitals  Enc Vitals Group     BP 09/26/19 1735 (!) 128/91     Pulse Rate 09/26/19 1735 97     Resp 09/26/19 1735 16     Temp 09/26/19 1735 98.5 F (36.9 C)     Temp Source 09/26/19 1735 Oral     SpO2 09/26/19 1735 98 %     Weight 09/26/19 1737 138 lb (62.6 kg)     Height --      Head Circumference --      Peak Flow --      Pain Score 09/26/19 1737 8     Pain Loc --      Pain Edu? --      Excl. in [place]? --    No data found.  Updated Vital Signs BP (!) 128/91 (BP Location: Right Arm)   Pulse 97   Temp 98.5 F (36.9 C) (Oral)   Resp 16   Wt 138 lb (62.6 kg)   LMP 09/24/2019   SpO2 98%   BMI 24.45 kg/m   Visual Acuity Right Eye Distance:   Left Eye Distance:   Bilateral Distance:    Right Eye Near:   Left Eye Near:    Bilateral Near:     Physical Exam Vitals and nursing note reviewed.  Constitutional:      General: She is awake. She is not in acute distress.    Appearance: She is well-developed,  well-groomed and normal weight. She is not ill-appearing.     Comments: Patient sitting quietly on exam table in no acute distress but appears upset.   HENT:     Head: Normocephalic and atraumatic.     Mouth/Throat:     Mouth: Mucous membranes are moist.  Eyes:     Extraocular Movements: Extraocular movements intact.     Conjunctiva/sclera: Conjunctivae normal.  Cardiovascular:     Rate and Rhythm: Normal rate and  regular rhythm.     Heart sounds: Normal heart sounds. No murmur.  Pulmonary:     Effort: Pulmonary effort is normal. No respiratory distress.     Breath sounds: Normal breath sounds and air entry. No decreased breath sounds, wheezing, rhonchi or rales.  Abdominal:     General: Abdomen is flat. Bowel sounds are normal. There is no distension.     Palpations: Abdomen is soft. There is no mass.     Tenderness: There is no abdominal tenderness. There is no right CVA tenderness, left CVA tenderness, guarding or rebound.  Genitourinary:    Comments: Patient declines pelvic exam- obtained self swab for STD testing.  Musculoskeletal:        General: Normal range of motion.     Cervical back: Normal range of motion.  Skin:    General: Skin is warm and dry.     Capillary Refill: Capillary refill takes less than 2 seconds.     Findings: Rash present. Rash is papular.          Comments: Small pink to red raised papular lesions present on left and right upper arms just anterior to axilla. No discharge, crusting or excoriation. No distinct urticaria. Similar lesions present bilaterally on upper inner thighs. No surrounding erythema. No distinct pattern.   Neurological:     General: No focal deficit present.     Mental Status: She is alert and oriented to person, place, and time.  Psychiatric:        Attention and Perception: Attention normal.        Mood and Affect: Mood is anxious. Affect is flat.        Speech: Speech normal.        Behavior: Behavior normal. Behavior is  cooperative.        Thought Content: Thought content does not include suicidal ideation.        Cognition and Memory: Cognition normal.        Judgment: Judgment normal.     Comments: Patient indicated she was upset with her partner and had thought about getting someone to "hurt him" but she did not have a plan and decided against doing anything to hurt him at this time.       UC Treatments / Results  Labs (all labs ordered are listed, but only abnormal results are displayed) Labs Reviewed  POC URINE PREG, ED  POCT PREGNANCY, URINE  CERVICOVAGINAL ANCILLARY ONLY    EKG   Radiology No results found.  Procedures Procedures (including critical care time)  Medications Ordered in UC Medications - No data to display  Initial Impression / Assessment and Plan / UC Course  I have reviewed the triage vital signs and the nursing notes.  Pertinent labs & imaging results that were available during my care of the patient were reviewed by me and considered in my medical decision making (see chart for details).    Reviewed negative urine pregnancy test with patient.  Discussed that she probably has Trichomoniasis but may have other STD's. Will test but only treat for Trich for now. Since she is nauseous and has had a decreased appetite, probably due to stress, recommend take Zofran 8mg  every 8 hours as needed. Since treatment for Trich can cause GI distress, recommend take Zofran as directed 20 minutes before eating, then eat a regular meal and take Flagyl 500mg  twice a day as directed for 7 days. No sexual intercourse for at least 7 days. Encouraged to use  condoms with each and every future sexual encounter.  Discussed that rash on upper arms and legs does not appear to be hives or reaction to antibiotic. Uncertain of cause but recommend trial Triamcinolone cream twice a day to affected areas as needed. Continue Hydroxyzine 3 times a day as directed to help with any itching and rash. Also  reviewed that we do not prescribe medication for weight gain or loss at Urgent Care- she will need to see her PCP for further evaluation. She was upset with her partner but reviewed that violence against her partner is not advisable and can lead to medical and legal issues. Patient confirmed that she was not going to harm herself or anyone else at this time. Recommend follow-up pending lab results and with her Neurologist tomorrow and with her PCP as planned.  Final Clinical Impressions(s) / UC Diagnoses   Final diagnoses:  Exposure to trichomonas  Nausea without vomiting  Exposure to STD  Dermatitis due to unknown cause  Vaginal discharge     Discharge Instructions     Recommend start Zofran 8mg  every 8 hours as needed for nausea- make sure you take 1 before you take the antibiotic. Start Flagyl (antibiotic) 500mg  twice a day for 7 days- take with food and after Zofran. No sexual intercourse for at least 7 days. Recommend use condoms with each and every future sexual encounter. May use Triamcinolone cream twice a day- apply to rash as needed. Follow-up pending lab results and with your Primary Care Provider as needed.     ED Prescriptions    Medication Sig Dispense Auth. Provider   metroNIDAZOLE (FLAGYL) 500 MG tablet Take 1 tablet (500 mg total) by mouth 2 (two) times daily for 7 days. Take with food. 14 tablet , NP   ondansetron (ZOFRAN) 8 MG tablet Take 1 tablet (8 mg total) by mouth every 8 (eight) hours as needed for nausea. 20 tablet , NP   triamcinolone cream (KENALOG) 0.1 % Apply 1 application topically 2 (two) times daily as needed. To rash 15 g Sudie Grumbling, NP     PDMP not reviewed this encounter.   Sudie Grumbling, NP 09/26/19 [phone]

## 2019-09-27 ENCOUNTER — Telehealth (INDEPENDENT_AMBULATORY_CARE_PROVIDER_SITE_OTHER): Payer: Medicaid Other | Admitting: Neurology

## 2019-09-27 ENCOUNTER — Encounter: Payer: Self-pay | Admitting: Neurology

## 2019-09-27 VITALS — Ht 63.0 in | Wt 125.0 lb

## 2019-09-27 DIAGNOSIS — G43009 Migraine without aura, not intractable, without status migrainosus: Secondary | ICD-10-CM | POA: Diagnosis not present

## 2019-09-27 MED ORDER — TOPIRAMATE 25 MG PO TABS: 25.0000 mg | ORAL_TABLET | Freq: Every day | ORAL | 3 refills | Status: AC

## 2019-09-27 MED ORDER — UBRELVY 100 MG PO TABS: 1.0000 | ORAL_TABLET | ORAL | 11 refills | Status: AC | PRN

## 2019-09-27 NOTE — Progress Notes (Addendum)
Shelby Shelton ([place]) Rx #: [phone] [name] 100MG  tablets   Form [facility] Call-In Form  Plan Contact [phone] phone Created 3 hours ago Sent to Plan 7 minutes ago Determination Call-in Form Please call the payer at [phone] to complete this PA.  Called [place] [facility]- submitted [phone] #: [place], REF #: [id number]. Call back within 24 hours for decision.  This PA was denied had to resubmit new PA on 09/30/19 PA #: 11/30/19. Ref #[id number] :. Will call back within 24 hours to check status.   PA Approved 09/30/19 through 09/24/20 per phone call with [facility] Tracks Ref #: [facility].

## 2019-09-28 ENCOUNTER — Telehealth (HOSPITAL_COMMUNITY): Payer: Self-pay | Admitting: Emergency Medicine

## 2019-09-28 ENCOUNTER — Telehealth (HOSPITAL_COMMUNITY): Payer: Self-pay | Admitting: *Deleted

## 2019-09-28 ENCOUNTER — Telehealth (HOSPITAL_COMMUNITY): Payer: Self-pay

## 2019-09-28 MED ORDER — TRIAMCINOLONE ACETONIDE 0.1 % EX CREA: 1.0000 "application " | TOPICAL_CREAM | Freq: Two times a day (BID) | CUTANEOUS | 0 refills | Status: DC | PRN

## 2019-09-28 MED ORDER — TRIAMCINOLONE ACETONIDE 0.1 % EX CREA: 1.0000 "application " | TOPICAL_CREAM | Freq: Two times a day (BID) | CUTANEOUS | 0 refills | Status: AC | PRN

## 2019-09-28 NOTE — Telephone Encounter (Signed)
Refilling cream with larger quantity for rash

## 2019-10-01 LAB — CERVICOVAGINAL ANCILLARY ONLY
Bacterial vaginitis: POSITIVE — AB
Candida vaginitis: NEGATIVE
Chlamydia: NEGATIVE
Neisseria Gonorrhea: NEGATIVE
Trichomonas: POSITIVE — AB

## 2019-10-02 ENCOUNTER — Encounter (HOSPITAL_COMMUNITY): Payer: Self-pay

## 2019-10-02 ENCOUNTER — Telehealth (HOSPITAL_COMMUNITY): Payer: Self-pay | Admitting: Emergency Medicine

## 2019-10-02 NOTE — Telephone Encounter (Signed)
Bacterial Vaginosis test is positive.  Prescription for metronidazole was given at the urgent care visit.  Trichomonas is positive. Rx metronidazole was given at the urgent care visit. Pt needs education to please refrain from sexual intercourse for 7 days to give the medicine time to work. Sexual partners need to be notified and tested/treated. Condoms may reduce risk of reinfection. Recheck for further evaluation if symptoms are not improving.   Attempted to reach patient. No answer at this time. Voicemail full  If you have any questions, you may call me at [phone]      

## 2019-10-04 ENCOUNTER — Ambulatory Visit (HOSPITAL_COMMUNITY)
Admission: EM | Admit: 2019-10-04 | Discharge: 2019-10-04 | Disposition: A | Discharge status: Home / Self Care | Payer: 59

## 2019-10-04 ENCOUNTER — Encounter (HOSPITAL_COMMUNITY): Payer: Self-pay

## 2019-10-04 ENCOUNTER — Other Ambulatory Visit: Payer: Self-pay

## 2019-10-04 DIAGNOSIS — B360 Pityriasis versicolor: Secondary | ICD-10-CM

## 2019-10-04 DIAGNOSIS — L906 Striae atrophicae: Secondary | ICD-10-CM | POA: Diagnosis not present

## 2019-10-04 MED ORDER — KETOCONAZOLE 2 % EX SHAM: MEDICATED_SHAMPOO | CUTANEOUS | 0 refills | Status: DC

## 2019-10-04 MED ORDER — KETOCONAZOLE 2 % EX SHAM: MEDICATED_SHAMPOO | CUTANEOUS | 0 refills | Status: AC

## 2019-10-04 NOTE — ED Provider Notes (Signed)
[facility]    CSN: [id number] Arrival date & time: 10/04/19  1806      History   Chief Complaint Chief Complaint  Patient presents with  . Rash    HPI Shelby Shelton is a 29 y.o. female.   Patient presents for evaluation of skin rash.  She reports she received Kenalog cream at a 09/26/2019 appointment.  She reports that the rashes partially resolved.  The rash is not itchy and is not painful and are mostly not bothersome other than how they appear.  She also reports white spots that she was previously given a shampoo for.  However she was able to use the shampoo due to reported nothing to reach her back.     Past Medical History:  Diagnosis Date  . Anxiety   . Depression   . Headache   . PTSD (post-traumatic stress disorder) 2020  . Schizoaffective disorder (HCC)     There are no problems to display for this patient.   Past Surgical History:  Procedure Laterality Date  . FINGER SURGERY  2015    OB History   No obstetric history on file.      Home Medications    Prior to Admission medications   Medication Sig Start Date End Date Taking? Authorizing Provider  hydrOXYzine (ATARAX/VISTARIL) 25 MG tablet Take 25 mg by mouth 3 (three) times daily as needed for anxiety.   Yes [provider]  nortriptyline (PAMELOR) 25 MG capsule TAKE 1 CAPSULE AT BEDTIME FOR 7 DAYS, THEN 2 CAPSULES AT BEDTIME FOR 7 DAYS, THEN 3 CAPSULES AT BEDTIME. CONTACT OFFICE FOR REFILLS. 07/15/19  Yes Jaffe, Adam R, DO  OLANZapine (ZYPREXA) 20 MG tablet Take 20 mg by mouth at bedtime.   Yes [provider]  venlafaxine (EFFEXOR) 75 MG tablet Take 75 mg by mouth daily.   Yes [provider]  ketoconazole (NIZORAL) 2 % shampoo Apply to back and leave on for 5 minutes, once a day for 1 week, then three times a week until resolved 10/04/19   Treveon Bourcier, Veryl Speak, PA-C  ondansetron (ZOFRAN) 8 MG tablet Take 1 tablet (8 mg total) by mouth every 8 (eight) hours as needed  for nausea. 09/26/19   Sudie Grumbling, NP  rizatriptan (MAXALT) 10 MG tablet Take 1 tablet earliest onset of migraine.  May repeat x1 in 2 hours if needed.  Max 2 tablets in 24h 06/28/18   Everlena Cooper, Adam R, DO  SUMAtriptan (IMITREX) 100 MG tablet Take 1 tablet earliest onset of headache.  May repeat once in 2 hours if headache persists or recurs.  Do not exceed 2 tablets in 24 hours 02/14/18   Drema Dallas, DO  topiramate (TOPAMAX) 25 MG tablet Take 1 tablet (25 mg total) by mouth at bedtime. 09/27/19   Drema Dallas, DO  triamcinolone cream (KENALOG) 0.1 % Apply 1 application topically 2 (two) times daily as needed. To rash 09/28/19   Wieters, Hallie C, PA-C  Ubrogepant (UBRELVY) 100 MG TABS Take 1 tablet by mouth as needed (May repeat in 2 hours.  Maximum 2 tablets in 24 hours). 09/27/19   Drema Dallas, DO    Family History Family History  Problem Relation Age of Onset  . Hypertension Mother   . Heart defect Brother   . Asthma Brother     Social History Social History   Tobacco Use  . Smoking status: Former Smoker    Quit date: 12/15/2017    Years  since quitting: 1.8  . Smokeless tobacco: Never Used  Substance Use Topics  . Alcohol use: No  . Drug use: No     Allergies   Patient has no known allergies.   Review of Systems Review of Systems  Constitutional: Negative for chills and fever.  Skin: Positive for color change and rash.  All other systems reviewed and are negative.    Physical Exam Triage Vital Signs ED Triage Vitals  Enc Vitals Group     BP 10/04/19 1852 120/87     Pulse Rate 10/04/19 1852 86     Resp 10/04/19 1852 16     Temp 10/04/19 1852 98.5 F (36.9 C)     Temp Source 10/04/19 1852 Oral     SpO2 10/04/19 1852 97 %     Weight --      Height --        Head Circumference --      Peak Flow --      Pain Score 10/04/19 1847 0     Pain Loc --      Pain Edu? --      Excl. in GC? --    No data found.  Updated Vital Signs BP 120/87 (BP Location: Left  Arm)   Pulse 86   Temp 98.5 F (36.9 C) (Oral)   Resp 16   LMP 09/17/2019 (Exact Date)   SpO2 97%   Visual Acuity Right Eye Distance:   Left Eye Distance:   Bilateral Distance:    Right Eye Near:   Left Eye Near:    Bilateral Near:     Physical Exam Vitals and nursing note reviewed.  Constitutional:      General: She is not in acute distress.    Appearance: Normal appearance. She is well-developed. She is not ill-appearing.  HENT:     Head: Normocephalic and atraumatic.  Eyes:     Conjunctiva/sclera: Conjunctivae normal.  Cardiovascular:     Rate and Rhythm: Normal rate and regular rhythm.     Heart sounds: No murmur.  Pulmonary:     Effort: Pulmonary effort is normal. No respiratory distress.  Abdominal:     Palpations: Abdomen is soft.  Musculoskeletal:     Cervical back: Neck supple.  Skin:    General: Skin is warm and dry.     Comments: Linear skin striations that are not raised with hyperpigmentation bilateral inner thighs and upper arms.  Is a consistent with skin striation or stretch marks.  Papular lesions or erythema present  Areas of circular hypopigmentation on her back consistent with tinea versicolor  Neurological:     Mental Status: She is alert.         UC Treatments / Results  Labs (all labs ordered are listed, but only abnormal results are displayed) Labs Reviewed - No data to display  EKG   Radiology No results found.  Procedures Procedures (including critical care time)  Medications Ordered in UC Medications - No data to display  Initial Impression / Assessment and Plan / UC Course  I have reviewed the triage vital signs and the nursing notes.  Pertinent labs & imaging results that were available during my care of the patient were reviewed by me and considered in my medical decision making (see chart for details).     #Stretch marks #Tinea versicolor Patient 29 year old female presenting with concern of skin rash.  Rashes  consistent with stretch marks versus a pathology.  Instructed patient to stop  steroid creams and monitor these and follow-up with primary care.  Refilled Nizoral shampoo and instructed to utilize body sponge in order to reach her back. -Patient to follow-up with primary care provider for continued concerning skin lesions. Final Clinical Impressions(s) / UC Diagnoses   Final diagnoses:  Stretch marks  Tinea versicolor     Discharge Instructions     I want you to use the shampoo with a body sponge as directed  I believe these may be natural markings on your skin however you may continue to use the Kenalog for 1 additional week but after that I would like for you to stop.  You may follow-up with your primary care dermatologist if you have additional questions.  Do not have a primary care please call the number attached     ED Prescriptions    Medication Sig Dispense Auth. Provider   ketoconazole (NIZORAL) 2 % shampoo  (Status: Discontinued) Apply to back and leave on for 5 minutes, once a day for 1 week, then three times a week until resolved 120 mL Telly Jawad, Marguerita Beards, PA-C   ketoconazole (NIZORAL) 2 % shampoo Apply to back and leave on for 5 minutes, once a day for 1 week, then three times a week until resolved 120 mL Avyan Livesay, Marguerita Beards, PA-C     PDMP not reviewed this encounter.   Purnell Shoemaker, PA-C 10/04/19 2039

## 2019-10-04 NOTE — ED Triage Notes (Signed)
Pt was in urgent care on 03/06 and was prescribed Kenalog cream for rash on upper arms and thighs.  States the medication has not helped at all.  Denies that rash is painful or itchy.

## 2019-10-04 NOTE — Discharge Instructions (Addendum)
I want you to use the shampoo with a body sponge as directed  I believe these may be natural markings on your skin however you may continue to use the Kenalog for 1 additional week but after that I would like for you to stop.  You may follow-up with your primary care dermatologist if you have additional questions.  Do not have a primary care please call the number attached
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
## 2020-01-30 NOTE — Progress Notes (Deleted)
NEUROLOGY FOLLOW UP OFFICE NOTE  Raeley Gilmore [id number]  HISTORY OF PRESENT ILLNESS: Aashna Matson a 29 year old right-handed female with hyperhidrosis since childhood who follows up for migraines.  UPDATE: Started topiramate in March.  She was given [name] ***  Current NSAIDS:none Current analgesics:acetaminophen Current triptans:none Current ergotamine:none Current anti-emetic:none Current muscle relaxants:none Current anti-anxiolytic:none Current sleep aide:none Current Antihypertensive medications:none Current Antidepressant medications:none Current Anticonvulsant medications:topiramate 25mg  Current anti-CGRP:Ubrelvy Current Vitamins/Herbal/Supplements:none Current Antihistamines/Decongestants:none Other therapy:none Other medication:none    Caffeine:no Alcohol:no Smoker:no Diet:hydrates Exercise:no Depression:no; Anxiety:yes Other pain:Back pain Sleep hygiene:poor  HISTORY: Onset: Since childhood, but progressively gotten worse since 2018 Location:Bi-frontal and top of head Quality:Pounding, but she gets electric shocks when she hears noise Initial intensity:Mild progressing to severe.Shedenies new headache, thunderclap headache. She wakes up with headaches. Aura:no Prodrome:no Postdrome:no Associated symptoms:Nausea, phonophobia, blurred vision, lethargy. She denies associated vomiting, photophobia, osmophobiaor unilateral numbness or weakness. Initial duration:3 to 4 days InitialFrequency:4 times a month InitialFrequency of abortive medication:BC powder daily Triggers: No Exacerbating factors:sounds Relieving factors:nothing Activity:aggravates  MRI of brain without contrast on 07/04/18 was unremarkable.  Past NSAIDS:Ibuprofen, naproxen Past analgesics:Excedrin migraine, tension headache, Tylenol, BC Past abortive triptans:sumatriptan,  rizatriptan (both ineffective) Past abortive ergotamine:no Past muscle relaxants:no Past anti-emetic:no Past antihypertensive medications:no Past antidepressant medications: nortriptyline (nausea, dry mouth), fluoxetine Past anticonvulsant medications:no Past anti-CGRP:no Past vitamins/Herbal/Supplements:no Past antihistamines/decongestants:no Other past therapies:no  Family history of headache:Mom  Given hyperhidrosis and tremulousness, TSH was checked on 06/28/18, which was 1.39.  PAST MEDICAL HISTORY: Past Medical History:  Diagnosis Date  . Anxiety   . Depression   . Headache   . PTSD (post-traumatic stress disorder) 2020  . Schizoaffective disorder (HCC)     MEDICATIONS: Current Outpatient Medications on File Prior to Visit  Medication Sig Dispense Refill  . hydrOXYzine (ATARAX/VISTARIL) 25 MG tablet Take 25 mg by mouth 3 (three) times daily as needed for anxiety.    14/5/19 ketoconazole (NIZORAL) 2 % shampoo Apply to back and leave on for 5 minutes, once a day for 1 week, then three times a week until resolved 120 mL 0  . nortriptyline (PAMELOR) 25 MG capsule TAKE 1 CAPSULE AT BEDTIME FOR 7 DAYS, THEN 2 CAPSULES AT BEDTIME FOR 7 DAYS, THEN 3 CAPSULES AT BEDTIME. CONTACT OFFICE FOR REFILLS. 90 capsule 0  . OLANZapine (ZYPREXA) 20 MG tablet Take 20 mg by mouth at bedtime.    . ondansetron (ZOFRAN) 8 MG tablet Take 1 tablet (8 mg total) by mouth every 8 (eight) hours as needed for nausea. 20 tablet 0  . rizatriptan (MAXALT) 10 MG tablet Take 1 tablet earliest onset of migraine.  May repeat x1 in 2 hours if needed.  Max 2 tablets in 24h 10 tablet 3  . SUMAtriptan (IMITREX) 100 MG tablet Take 1 tablet earliest onset of headache.  May repeat once in 2 hours if headache persists or recurs.  Do not exceed 2 tablets in 24 hours 10 tablet 3  . topiramate (TOPAMAX) 25 MG tablet Take 1 tablet (25 mg total) by mouth at bedtime. 30 tablet 3  . triamcinolone cream  (KENALOG) 0.1 % Apply 1 application topically 2 (two) times daily as needed. To rash 45 g 0  . Ubrogepant (UBRELVY) 100 MG TABS Take 1 tablet by mouth as needed (May repeat in 2 hours.  Maximum 2 tablets in 24 hours). 10 tablet 11  . venlafaxine (EFFEXOR) 75 MG tablet Take 75 mg by mouth daily.     No current facility-administered medications  on file prior to visit.    ALLERGIES: No Known Allergies  FAMILY HISTORY: Family History  Problem Relation Age of Onset  . Hypertension Mother   . Heart defect Brother   . Asthma Brother     SOCIAL HISTORY: Social History   Socioeconomic History  . Marital status: Single    Spouse name: Not on file  . Number of children: 0  . Years of education: Not on file  . Highest education level: High school graduate  Occupational History  . Not on file  Tobacco Use  . Smoking status: Former Smoker    Quit date: 12/15/2017    Years since quitting: 2.1  . Smokeless tobacco: Never Used  Vaping Use  . Vaping Use: Never used  Substance and Sexual Activity  . Alcohol use: No  . Drug use: No  . Sexual activity: Never    Birth control/protection: None  Other Topics Concern  . Not on file  Social History Narrative   Pt lives with family in apartment   She has no children   Right handed   She does not exercise   She does not drink coffee, tea nor soda   Social Determinants of Health   Financial Resource Strain:   . Difficulty of Paying Living Expenses:   Food Insecurity:   . Worried About Programme researcher, broadcasting/film/video in the Last Year:   . Barista in the Last Year:   Transportation Needs:   . Freight forwarder (Medical):   Marland Kitchen Lack of Transportation (Non-Medical):   Physical Activity:   . Days of Exercise per Week:   . Minutes of Exercise per Session:   Stress:   . Feeling of Stress :   Social Connections:   . Frequency of Communication with Friends and Family:   . Frequency of Social Gatherings with Friends and Family:   . Attends  Religious Services:   . Active Member of Clubs or Organizations:   . Attends Banker Meetings:   Marland Kitchen Marital Status:   Intimate Partner Violence:   . Fear of Current or Ex-Partner:   . Emotionally Abused:   Marland Kitchen Physically Abused:   . Sexually Abused:     PHYSICAL EXAM: *** General: No acute distress.  Patient appears well-groomed.   Head:  Normocephalic/atraumatic Eyes:  Fundi examined but not visualized Neck: supple, no paraspinal tenderness, full range of motion Heart:  Regular rate and rhythm Lungs:  Clear to auscultation bilaterally Back: No paraspinal tenderness Neurological Exam: alert and oriented to person, place, and time. Attention span and concentration intact, recent and remote memory intact, fund of knowledge intact.  Speech fluent and not dysarthric, language intact.  CN II-XII intact. Bulk and tone normal, muscle strength 5/5 throughout.  Sensation to light touch, temperature and vibration intact.  Deep tendon reflexes 2+ throughout, toes downgoing.  Finger to nose and heel to shin testing intact.  Gait normal, Romberg negative.  IMPRESSION: Migraine without aura, without status migrainosus, not intractable  PLAN: 1.  For preventative management, *** 2.  For abortive therapy, *** 3.  Limit use of pain relievers to no more than 2 days out of week to prevent risk of rebound or medication-overuse headache. 4.  Keep headache diary 5.  Exercise, hydration, caffeine cessation, sleep hygiene, monitor for and avoid triggers 6. Follow up ***   Shon Millet, DO  CC: ***

## 2020-02-03 ENCOUNTER — Ambulatory Visit: Payer: Medicaid Other | Admitting: Neurology

## 2020-06-22 NOTE — Progress Notes (Signed)
Due to the COVID-19 crisis, this virtual check-in visit was done via telephone from my office and it was initiated and consent given by this patient and or family.  Telephone (Audio) Visit The purpose of this telephone visit is to provide medical care while limiting exposure to the novel coronavirus.  Patient was unable to log on for a video virtual visit.  Consent was obtained for telephone visit and initiated by pt/family:  Yes.   Answered questions that patient had about telehealth interaction:  Yes.   I discussed the limitations, risks, security and privacy concerns of performing an evaluation and management service by telephone. I also discussed with the patient that there may be a patient responsible charge related to this service. The patient expressed understanding and agreed to proceed.  Pt location: Home Physician Location: office Name of referring provider:  No ref. provider found I connected with .Shelby Shelton at patients initiation/request on 06/24/2020 at 10:10 AM EST by telephone and verified that I am speaking with the correct person using two identifiers.  Pt MRN:  [id number] Pt DOB:  03-16-1991  Assessment/Plan:   Migraine without aura, controlled.  However, she has been treating them in an inconsistent unorthodox manner, switching off between topiramate and nortriptyline every few days.  I would like her to be on a consistent management.  Also to avoid polypharmacy, I would like her to get off of topiramate and nortriptyline if possible.  1.  Will start Aimovig 70mg  monthly.  She will remain on topiramate and nortriptyline for now.  If still doing well at next follow up, then we will discontinue topiramate and nortriptyline. 2.  Limit use of pain relievers to no more than 2 days out of week to prevent risk of rebound or medication-overuse headache. 3.  Keep headache diary 4.  Follow up 4 to 6 months.  Need for in person visit now:  No  Subjective:   Shelby  Hawkinsis a 29 year old right-handed female with hyperhidrosis since childhood who follows up for migraines.  UPDATE: Started topiramate in March.  She decided   Current NSAIDS:none Current analgesics:acetaminophen Current triptans:none Current ergotamine:none Current anti-emetic:none Current muscle relaxants:none Current anti-anxiolytic:none Current sleep aide:none Current Antihypertensive medications:none Current Antidepressant medications:none Current Anticonvulsant medications:topiramate 25mg  at bedtime Current anti-CGRP:none Current Vitamins/Herbal/Supplements:none Current Antihistamines/Decongestants:none Other therapy:none Other medication:none   Caffeine:no Alcohol:no Smoker:no Diet:hydrates Exercise:no Depression:no; Anxiety:yes Other pain:Back pain Sleep hygiene:poor  HISTORY: Onset: Since childhood, but progressively gotten worse since 2018 Location:Bi-frontal and top of head Quality:Pounding, but she gets electric shocks when she hears noise Initial intensity:Mild progressing to severe.Shedenies new headache, thunderclap headache. She wakes up with headaches. Aura:no Prodrome:no Postdrome:no Associated symptoms:Nausea, phonophobia, blurred vision, lethargy. She denies associated vomiting, photophobia, osmophobiaor unilateral numbness or weakness. Initial duration:3 to 4 days InitialFrequency:4 times a month InitialFrequency of abortive medication:BC powder daily Triggers: No Exacerbating factors:sounds Relieving factors:nothing Activity:aggravates  MRI of brain without contrast on 07/04/18 was unremarkable.  Past NSAIDS:Ibuprofen, naproxen Past analgesics:Excedrin migraine, tension headache, Tylenol, BC Past abortive triptans:sumatriptan, rizatriptan (both ineffective) Past abortive ergotamine:no Past muscle relaxants:no Past anti-emetic:no Past  antihypertensive medications:no Past antidepressant medications: Nortriptyline75mg  at bedtime (nausea, dry mouth) Past anticonvulsant medications:no Past anti-CGRP:no Past vitamins/Herbal/Supplements:no Past antihistamines/decongestants:no Other past therapies:no  Family history of headache:Mom    Objective:   There were no vitals filed for this visit.   Follow Up Instructions:      -I discussed the assessment and treatment plan with the patient. The patient was provided an opportunity to ask  questions and all were answered. The patient agreed with the plan and demonstrated an understanding of the instructions.   The patient was advised to call back or seek an in-person evaluation if the symptoms worsen or if the condition fails to improve as anticipated.    Total Time spent in visit with the patient was:  23 minutes   Cira Servant, DO

## 2020-06-24 ENCOUNTER — Encounter: Payer: Self-pay | Admitting: Neurology

## 2020-06-24 ENCOUNTER — Telehealth (INDEPENDENT_AMBULATORY_CARE_PROVIDER_SITE_OTHER): Payer: 59 | Admitting: Neurology

## 2020-06-24 ENCOUNTER — Other Ambulatory Visit: Payer: Self-pay

## 2020-06-24 DIAGNOSIS — G43009 Migraine without aura, not intractable, without status migrainosus: Secondary | ICD-10-CM

## 2020-06-24 MED ORDER — AIMOVIG 70 MG/ML ~~LOC~~ SOAJ: 70.0000 mg | SUBCUTANEOUS | 5 refills | Status: AC

## 2021-09-17 ENCOUNTER — Encounter (HOSPITAL_COMMUNITY): Payer: Self-pay | Admitting: Emergency Medicine

## 2021-09-17 ENCOUNTER — Other Ambulatory Visit: Payer: Self-pay

## 2021-09-17 ENCOUNTER — Ambulatory Visit (HOSPITAL_COMMUNITY)
Admission: EM | Admit: 2021-09-17 | Discharge: 2021-09-17 | Disposition: A | Discharge status: Home / Self Care | Payer: Medicaid Other | Attending: Physician Assistant | Admitting: Physician Assistant

## 2021-09-17 DIAGNOSIS — R3915 Urgency of urination: Secondary | ICD-10-CM | POA: Diagnosis present

## 2021-09-17 DIAGNOSIS — R350 Frequency of micturition: Secondary | ICD-10-CM | POA: Diagnosis present

## 2021-09-17 LAB — POCT URINALYSIS DIPSTICK, ED / UC
Bilirubin Urine: NEGATIVE
Glucose, UA: NEGATIVE mg/dL
Leukocytes,Ua: NEGATIVE
Nitrite: NEGATIVE
Protein, ur: NEGATIVE mg/dL
Specific Gravity, Urine: 1.025 (ref 1.005–1.030)
Urobilinogen, UA: 0.2 mg/dL (ref 0.0–1.0)
pH: 7 (ref 5.0–8.0)

## 2021-09-17 LAB — POC URINE PREG, ED: Preg Test, Ur: NEGATIVE

## 2021-09-17 NOTE — ED Triage Notes (Signed)
Pt reports bladder pain and loss of appetite x 1 week. Pt states she has a bladder infection.

## 2021-09-17 NOTE — ED Provider Notes (Signed)
Shelby Shelton    CSN: [id number] Arrival date & time: 09/17/21  1612      History   Chief Complaint Chief Complaint  Patient presents with   Cystitis    HPI Shelby Shelton is a 31 y.o. female.   Patient presents today with a 1 week history of intermittent bladder pain.  She describes this as urinary frequency and urgency that is intermittent without identifiable trigger.  Denies any dysuria, abdominal pain, fever, nausea, vomiting.  Denies any pelvic pain or vaginal discharge.  She does report decreased appetite.  She has not tried any over-the-counter medications for symptom management but has increased cranberry/cherry juice intake without improvement.  Denies any recent catheterization, recent urogenital procedure, history of nephrolithiasis, seeing urology, history of single kidney.  She denies any recent antibiotic use.  She is confident she is not pregnant as she is currently on her menstrual cycle.  She has no additional complaints or concerns today.   Past Medical History:  Diagnosis Date   Anxiety    Depression    Headache    PTSD (post-traumatic stress disorder) 2020   Schizoaffective disorder (HCC)     There are no problems to display for this patient.   Past Surgical History:  Procedure Laterality Date   FINGER SURGERY  2015    OB History   No obstetric history on file.      Home Medications    Prior to Admission medications   Medication Sig Start Date End Date Taking? Authorizing Provider  Erenumab-aooe (AIMOVIG) 70 MG/ML SOAJ Inject 70 mg into the skin every 28 (twenty-eight) days. 06/24/20   Pieter Partridge, DO  hydrOXYzine (ATARAX/VISTARIL) 25 MG tablet Take 25 mg by mouth 3 (three) times daily as needed for anxiety.    [provider]  ketoconazole (NIZORAL) 2 % shampoo Apply to back and leave on for 5 minutes, once a day for 1 week, then three times a week until resolved 10/04/19   Darr, Edison Nasuti, PA-C  nortriptyline (PAMELOR) 25 MG  capsule TAKE 1 CAPSULE AT BEDTIME FOR 7 DAYS, THEN 2 CAPSULES AT BEDTIME FOR 7 DAYS, THEN 3 CAPSULES AT BEDTIME. CONTACT OFFICE FOR REFILLS. 07/15/19   Tomi Likens, Adam R, DO  OLANZapine (ZYPREXA) 20 MG tablet Take 20 mg by mouth at bedtime.    [provider]  ondansetron (ZOFRAN) 8 MG tablet Take 1 tablet (8 mg total) by mouth every 8 (eight) hours as needed for nausea. 09/26/19   Katy Apo, NP  rizatriptan (MAXALT) 10 MG tablet Take 1 tablet earliest onset of migraine.  May repeat x1 in 2 hours if needed.  Max 2 tablets in 24h 06/28/18   Tomi Likens, Adam R, DO  SUMAtriptan (IMITREX) 100 MG tablet Take 1 tablet earliest onset of headache.  May repeat once in 2 hours if headache persists or recurs.  Do not exceed 2 tablets in 24 hours Patient not taking: Reported on 06/24/2020 02/14/18   Pieter Partridge, DO  topiramate (TOPAMAX) 25 MG tablet Take 1 tablet (25 mg total) by mouth at bedtime. Patient not taking: Reported on 06/24/2020 09/27/19   Pieter Partridge, DO  triamcinolone cream (KENALOG) 0.1 % Apply 1 application topically 2 (two) times daily as needed. To rash 09/28/19   Wieters, Hallie C, PA-C  Ubrogepant (UBRELVY) 100 MG TABS Take 1 tablet by mouth as needed (May repeat in 2 hours.  Maximum 2 tablets in 24 hours). Patient not taking: Reported on 06/24/2020 09/27/19  Tomi Likens, Adam R, DO  venlafaxine (EFFEXOR) 75 MG tablet Take 75 mg by mouth daily.    [provider]    Family History Family History  Problem Relation Age of Onset   Hypertension Mother    Heart defect Brother    Asthma Brother     Social History Social History   Tobacco Use   Smoking status: Former    Types: Cigarettes    Quit date: 12/15/2017    Years since quitting: 3.7   Smokeless tobacco: Never  Vaping Use   Vaping Use: Never used  Substance Use Topics   Alcohol use: No   Drug use: No     Allergies   Patient has no known allergies.   Review of Systems Review of Systems  Constitutional:  Negative  for activity change, appetite change, fatigue and fever.  Respiratory:  Negative for cough and shortness of breath.   Cardiovascular:  Negative for chest pain.  Gastrointestinal:  Negative for abdominal pain, diarrhea, nausea and vomiting.  Genitourinary:  Positive for frequency and urgency. Negative for dysuria, pelvic pain, vaginal bleeding, vaginal discharge and vaginal pain.  Neurological:  Negative for dizziness, light-headedness and headaches.    Physical Exam Triage Vital Signs ED Triage Vitals  Enc Vitals Group     BP 09/17/21 1658 (!) 132/94     Pulse Rate 09/17/21 1658 86     Resp 09/17/21 1658 16     Temp 09/17/21 1658 98.8 F (37.1 C)     Temp Source 09/17/21 1658 Oral     SpO2 09/17/21 1658 97 %     Weight 09/17/21 1656 125 lb (56.7 kg)     Height 09/17/21 1656 5\' 3"  (1.6 m)     Head Circumference --      Peak Flow --      Pain Score 09/17/21 1656 0     Pain Loc --      Pain Edu? --      Excl. in [place]? --    No data found.  Updated Vital Signs BP (!) 132/94 (BP Location: Right Arm)    Pulse 86    Temp 98.8 F (37.1 C) (Oral)    Resp 16    Ht 5\' 3"  (1.6 m)    Wt 125 lb (56.7 kg)    LMP 09/14/2021 (Exact Date)    SpO2 97%    BMI 22.14 kg/m   Visual Acuity Right Eye Distance:   Left Eye Distance:   Bilateral Distance:    Right Eye Near:   Left Eye Near:    Bilateral Near:     Physical Exam Vitals reviewed.  Constitutional:      General: She is awake. She is not in acute distress.    Appearance: Normal appearance. She is well-developed. She is not ill-appearing.     Comments: Very pleasant female appears stated age in no acute distress sitting comfortably on exam room table  HENT:     Head: Normocephalic and atraumatic.  Cardiovascular:     Rate and Rhythm: Normal rate and regular rhythm.     Heart sounds: Normal heart sounds, S1 normal and S2 normal. No murmur heard. Pulmonary:     Effort: Pulmonary effort is normal.     Breath sounds: Normal breath  sounds. No wheezing, rhonchi or rales.     Comments: Clear to auscultation bilaterally Abdominal:     General: Bowel sounds are normal.     Palpations: Abdomen is soft.  Tenderness: There is no abdominal tenderness. There is no right CVA tenderness, left CVA tenderness, guarding or rebound.     Comments: Benign abdominal exam.  No tenderness on exam.  No CVA tenderness.  Genitourinary:    Comments: Exam deferred Psychiatric:        Behavior: Behavior is cooperative.     UC Treatments / Results  Labs (all labs ordered are listed, but only abnormal results are displayed) Labs Reviewed  POCT URINALYSIS DIPSTICK, ED / UC - Abnormal; Notable for the following components:      Result Value   Ketones, ur TRACE (*)    Hgb urine dipstick MODERATE (*)    All other components within normal limits  URINE CULTURE  POC URINE PREG, ED    EKG   Radiology No results found.  Procedures Procedures (including critical care time)  Medications Ordered in UC Medications - No data to display  Initial Impression / Assessment and Plan / UC Course  I have reviewed the triage vital signs and the nursing notes.  Pertinent labs & imaging results that were available during my care of the patient were reviewed by me and considered in my medical decision making (see chart for details).     Urine obtained today showed trace ketones and hemoglobin.  Hemoglobin is likely related to menstrual bleeding.  No evidence of acute infection so antibiotic initiation was deferred.  Urine culture was obtained and will consider antibiotics based on culture results.  Patient declined STI testing.  Patient was encouraged to push fluids by drinking a minimum of 64 ounces per day and avoiding caffeine.  Discussed concern for interstitial cystitis as etiology of symptoms and if symptoms persist she should follow-up with a urologist.  She was given contact information for local provider encouraged to call schedule an  appointment if symptoms persist.  Discussed that if she develops any additional symptoms including abdominal pain, hematuria, dysuria, nausea, vomiting, fever she needs to be seen immediately.  Strict return precautions given to which she expressed understanding.  Work excuse note provided.  Final Clinical Impressions(s) / UC Diagnoses   Final diagnoses:  Urinary frequency  Urinary urgency     Discharge Instructions      Your urine was normal with the exception of blood which is likely related to your menstrual cycle.  We will contact you if your urine culture indicates we need to start antibiotics.  Make sure you are drinking plenty of fluid and avoid caffeine.  If symptoms not improving please follow-up with urology as we discussed.  If you have any severe symptoms including abdominal pain, blood in your urine, nausea/vomiting, fever, back pain you should be seen immediately.     ED Prescriptions   None    PDMP not reviewed this encounter.   Terrilee Croak, PA-C 09/17/21 1724

## 2021-09-17 NOTE — Discharge Instructions (Signed)
Your urine was normal with the exception of blood which is likely related to your menstrual cycle.  We will contact you if your urine culture indicates we need to start antibiotics.  Make sure you are drinking plenty of fluid and avoid caffeine.  If symptoms not improving please follow-up with urology as we discussed.  If you have any severe symptoms including abdominal pain, blood in your urine, nausea/vomiting, fever, back pain you should be seen immediately.

## 2021-09-19 LAB — URINE CULTURE

## 2022-04-18 ENCOUNTER — Ambulatory Visit (HOSPITAL_COMMUNITY)
Admission: EM | Admit: 2022-04-18 | Discharge: 2022-04-18 | Disposition: A | Discharge status: Home / Self Care | Payer: Medicaid Other | Attending: Emergency Medicine | Admitting: Emergency Medicine

## 2022-04-18 ENCOUNTER — Other Ambulatory Visit: Payer: Self-pay

## 2022-04-18 ENCOUNTER — Ambulatory Visit (INDEPENDENT_AMBULATORY_CARE_PROVIDER_SITE_OTHER): Payer: Medicaid Other

## 2022-04-18 ENCOUNTER — Encounter (HOSPITAL_COMMUNITY): Payer: Self-pay | Admitting: *Deleted

## 2022-04-18 DIAGNOSIS — M25531 Pain in right wrist: Secondary | ICD-10-CM

## 2022-04-18 MED ORDER — PREDNISONE 20 MG PO TABS: 40.0000 mg | ORAL_TABLET | Freq: Every day | ORAL | 0 refills | Status: AC

## 2022-04-18 MED ORDER — CYCLOBENZAPRINE HCL 10 MG PO TABS: 10.0000 mg | ORAL_TABLET | Freq: Two times a day (BID) | ORAL | 0 refills | Status: AC | PRN

## 2022-04-18 NOTE — ED Provider Notes (Signed)
[facility]    CSN: [id number] Arrival date & time: 04/18/22  5397      History   Chief Complaint Chief Complaint  Patient presents with   Wrist Pain    HPI Shelby Shelton is a 31 y.o. female.   Patient presents with right wrist and hand pain beginning 1 day ago after fall.  Endorses she was playing kickball when she slipped and fell landing on the palmar aspect of the hand where she felt a popping sound in the wrist.  Has had constant pain since with pain radiating from the hand into the wrist.  Range of motion is intact but elicits severe pain.  Associated weakness and numbness to the dorsal aspect.  Has attempted use of IcyHot spray which has been ineffective.    Past Medical History:  Diagnosis Date   Anxiety    Depression    Headache    PTSD (post-traumatic stress disorder) 2020   Schizoaffective disorder (HCC)     There are no problems to display for this patient.   Past Surgical History:  Procedure Laterality Date   FINGER SURGERY  2015    OB History   No obstetric history on file.      Home Medications    Prior to Admission medications   Medication Sig Start Date End Date Taking? Authorizing Provider  Erenumab-aooe (AIMOVIG) 70 MG/ML SOAJ Inject 70 mg into the skin every 28 (twenty-eight) days. 06/24/20   Drema Dallas, DO  hydrOXYzine (ATARAX/VISTARIL) 25 MG tablet Take 25 mg by mouth 3 (three) times daily as needed for anxiety.    [provider]  ketoconazole (NIZORAL) 2 % shampoo Apply to back and leave on for 5 minutes, once a day for 1 week, then three times a week until resolved 10/04/19   Darr, Gerilyn Pilgrim, PA-C  nortriptyline (PAMELOR) 25 MG capsule TAKE 1 CAPSULE AT BEDTIME FOR 7 DAYS, THEN 2 CAPSULES AT BEDTIME FOR 7 DAYS, THEN 3 CAPSULES AT BEDTIME. CONTACT OFFICE FOR REFILLS. 07/15/19   Everlena Cooper, Adam R, DO  OLANZapine (ZYPREXA) 20 MG tablet Take 20 mg by mouth at bedtime.    [provider]  ondansetron (ZOFRAN) 8 MG  tablet Take 1 tablet (8 mg total) by mouth every 8 (eight) hours as needed for nausea. 09/26/19   Sudie Grumbling, NP  rizatriptan (MAXALT) 10 MG tablet Take 1 tablet earliest onset of migraine.  May repeat x1 in 2 hours if needed.  Max 2 tablets in 24h 06/28/18   Everlena Cooper, Adam R, DO  SUMAtriptan (IMITREX) 100 MG tablet Take 1 tablet earliest onset of headache.  May repeat once in 2 hours if headache persists or recurs.  Do not exceed 2 tablets in 24 hours Patient not taking: Reported on 06/24/2020 02/14/18   Drema Dallas, DO  topiramate (TOPAMAX) 25 MG tablet Take 1 tablet (25 mg total) by mouth at bedtime. Patient not taking: Reported on 06/24/2020 09/27/19   Drema Dallas, DO  triamcinolone cream (KENALOG) 0.1 % Apply 1 application topically 2 (two) times daily as needed. To rash 09/28/19   Wieters, Hallie C, PA-C  Ubrogepant (UBRELVY) 100 MG TABS Take 1 tablet by mouth as needed (May repeat in 2 hours.  Maximum 2 tablets in 24 hours). Patient not taking: Reported on 06/24/2020 09/27/19   Drema Dallas, DO  venlafaxine (EFFEXOR) 75 MG tablet Take 75 mg by mouth daily.    [provider]    Family History Family  History  Problem Relation Age of Onset   Hypertension Mother    Heart defect Brother    Asthma Brother     Social History Social History   Tobacco Use   Smoking status: Former    Types: Cigarettes    Quit date: 12/15/2017    Years since quitting: 4.3   Smokeless tobacco: Never  Vaping Use   Vaping Use: Never used  Substance Use Topics   Alcohol use: No   Drug use: No     Allergies   Patient has no known allergies.   Review of Systems Review of Systems   Physical Exam Triage Vital Signs ED Triage Vitals  Enc Vitals Group     BP 04/18/22 0931 (!) 128/91     Pulse Rate 04/18/22 0931 79     Resp 04/18/22 0931 16     Temp 04/18/22 0931 98.8 F (37.1 C)     Temp src --      SpO2 04/18/22 0931 100 %     Weight --      Height --      Head Circumference --       Peak Flow --      Pain Score 04/18/22 0928 8     Pain Loc --      Pain Edu? --      Excl. in [place]? --    No data found.  Updated Vital Signs BP (!) 128/91   Pulse 79   Temp 98.8 F (37.1 C)   Resp 16   LMP 03/28/2022   SpO2 100%   Visual Acuity Right Eye Distance:   Left Eye Distance:   Bilateral Distance:    Right Eye Near:   Left Eye Near:    Bilateral Near:     Physical Exam Constitutional:      Appearance: Normal appearance.  Eyes:     Extraocular Movements: Extraocular movements intact.  Pulmonary:     Effort: Pulmonary effort is normal.  Musculoskeletal:     Comments: Tenderness is present throughout the wrist on the volar and dorsal aspect without point tenderness, no ecchymosis, swelling or deformity present, range of motion intact but pain is elicited with rotation, palpating over the scaphoid bone in the medial aspect of the right hand pain is elicited in the wrist, 2+ radial pulse, sensation intact  Neurological:     Mental Status: She is alert and oriented to person, place, and time. Mental status is at baseline.  Psychiatric:        Mood and Affect: Mood normal.        Behavior: Behavior normal.      UC Treatments / Results  Labs (all labs ordered are listed, but only abnormal results are displayed) Labs Reviewed - No data to display  EKG   Radiology No results found.  Procedures Procedures (including critical care time)  Medications Ordered in UC Medications - No data to display  Initial Impression / Assessment and Plan / UC Course  I have reviewed the triage vital signs and the nursing notes.  Pertinent labs & imaging results that were available during my care of the patient were reviewed by me and considered in my medical decision making (see chart for details).  Right wrist pain  X-ray negative, discussed with patient, wrist brace applied by nursing staff, neurovascularly intact prior to and after placement, may use when completing  activities with removal at rest, recommended RICE, prescribed prednisone and Flexeril for outpatient use,  given walker referral to orthopedics if symptoms persist or worsen note given Final Clinical Impressions(s) / UC Diagnoses   Final diagnoses:  None   Discharge Instructions   None    ED Prescriptions   None    PDMP not reviewed this encounter.   Valinda Hoar, NP 04/18/22 1036

## 2022-04-18 NOTE — ED Triage Notes (Signed)
Pt reports injury to Rt wrist yesterday while playing kick ball. Pt slipped and fell on Rt wrist and heard a pop . Pt has swelling to Rt wrist and hand.

## 2022-04-18 NOTE — Discharge Instructions (Signed)
X-rays negative for injury to the bone  A wrist brace has been applied here in the office for you, you may wear whenever completing activities to add stability and support, may remove at rest  Starting today take prednisone every morning with food for 5 days, this reduces inflammation which in turn will help with your pain, you may use Tylenol in addition to this medicine  You may use muscle relaxer twice daily as needed for additional comfort, be mindful this medication may make you drowsy  You may use ice or heat over the affected area in 10 to 15-minute intervals for general comfort and to further help reduce swelling  You may elevate arm when sitting and lying on a pillow  If your symptoms continue to persist for greater than 2 weeks or at any point worsen you may follow-up with orthopedics, information is listed on the front page

## 2022-07-06 ENCOUNTER — Telehealth: Payer: Medicaid Other | Admitting: Emergency Medicine

## 2022-07-06 DIAGNOSIS — N3001 Acute cystitis with hematuria: Secondary | ICD-10-CM | POA: Diagnosis not present

## 2022-07-06 MED ORDER — CEPHALEXIN 500 MG PO CAPS: 500.0000 mg | ORAL_CAPSULE | Freq: Two times a day (BID) | ORAL | 0 refills | Status: AC

## 2022-07-06 NOTE — Progress Notes (Signed)
E-Visit for Urinary Problems ? ?We are sorry that you are not feeling well.  Here is how we plan to help! ? ?Based on what you shared with me it looks like you most likely have a simple urinary tract infection. ? ?A UTI (Urinary Tract Infection) is a bacterial infection of the bladder. ? ?Most cases of urinary tract infections are simple to treat but a key part of your care is to encourage you to drink plenty of fluids and watch your symptoms carefully. ? ?I have prescribed Keflex 500 mg twice a day for 7 days.  Your symptoms should gradually improve. Call us if the burning in your urine worsens, you develop worsening fever, back pain or pelvic pain or if your symptoms do not resolve after completing the antibiotic. ? ?Urinary tract infections can be prevented by drinking plenty of water to keep your body hydrated.  Also be sure when you wipe, wipe from front to back and don't hold it in!  If possible, empty your bladder every 4 hours. ? ?HOME CARE ?Drink plenty of fluids ?Compete the full course of the antibiotics even if the symptoms resolve ?Remember, when you need to go?go. Holding in your urine can increase the likelihood of getting a UTI! ?GET HELP RIGHT AWAY IF: ?You cannot urinate ?You get a high fever ?Worsening back pain occurs ?You see blood in your urine ?You feel sick to your stomach or throw up ?You feel like you are going to pass out ? ?MAKE SURE YOU  ?Understand these instructions. ?Will watch your condition. ?Will get help right away if you are not doing well or get worse. ? ? ?Thank you for choosing an e-visit. ? ?Your e-visit answers were reviewed by a board certified advanced clinical practitioner to complete your personal care plan. Depending upon the condition, your plan could have included both over the counter or prescription medications. ? ?Please review your pharmacy choice. Make sure the pharmacy is open so you can pick up prescription now. If there is a problem, you may contact your  provider through MyChart messaging and have the prescription routed to another pharmacy.  Your safety is important to us. If you have drug allergies check your prescription carefully.  ? ?For the next 24 hours you can use MyChart to ask questions about today's visit, request a non-urgent call back, or ask for a work or school excuse. ?You will get an email in the next two days asking about your experience. I hope that your e-visit has been valuable and will speed your recovery. ? ?I have spent 5 minutes in review of e-visit questionnaire, review and updating patient chart, medical decision making and response to patient.  ? ?Jacody Beneke, PhD, FNP-BC ?  ?

## 2022-08-22 ENCOUNTER — Telehealth: Payer: Medicaid Other | Admitting: Physician Assistant

## 2022-08-22 DIAGNOSIS — R3989 Other symptoms and signs involving the genitourinary system: Secondary | ICD-10-CM

## 2022-08-22 DIAGNOSIS — R110 Nausea: Secondary | ICD-10-CM | POA: Diagnosis not present

## 2022-08-22 MED ORDER — ONDANSETRON HCL 4 MG PO TABS: 4.0000 mg | ORAL_TABLET | Freq: Three times a day (TID) | ORAL | 0 refills | Status: DC | PRN

## 2022-08-22 MED ORDER — SULFAMETHOXAZOLE-TRIMETHOPRIM 800-160 MG PO TABS: 1.0000 | ORAL_TABLET | Freq: Two times a day (BID) | ORAL | 0 refills | Status: AC

## 2022-08-22 NOTE — Patient Instructions (Signed)
Shelby Shelton, thank you for joining Margaretann Loveless, PA-C for today's virtual visit.  While this provider is not your primary care provider (PCP), if your PCP is located in our provider database this encounter information will be shared with them immediately following your visit.   A [facility] MyChart account gives you access to today's visit and all your visits, tests, and labs performed at [facility] " click here if you don't have a [facility] MyChart account or go to [email or web address]  Consent: (Patient) Shelby Shelton provided verbal consent for this virtual visit at the beginning of the encounter.  Current Medications:  Current Outpatient Medications:    ondansetron (ZOFRAN) 4 MG tablet, Take 1 tablet (4 mg total) by mouth every 8 (eight) hours as needed for nausea or vomiting., Disp: 20 tablet, Rfl: 0   sulfamethoxazole-trimethoprim (BACTRIM DS) 800-160 MG tablet, Take 1 tablet by mouth 2 (two) times daily., Disp: 10 tablet, Rfl: 0   cyclobenzaprine (FLEXERIL) 10 MG tablet, Take 1 tablet (10 mg total) by mouth 2 (two) times daily as needed for muscle spasms., Disp: 20 tablet, Rfl: 0   Erenumab-aooe (AIMOVIG) 70 MG/ML SOAJ, Inject 70 mg into the skin every 28 (twenty-eight) days., Disp: 1.12 mL, Rfl: 5   hydrOXYzine (ATARAX/VISTARIL) 25 MG tablet, Take 25 mg by mouth 3 (three) times daily as needed for anxiety., Disp: , Rfl:    ketoconazole (NIZORAL) 2 % shampoo, Apply to back and leave on for 5 minutes, once a day for 1 week, then three times a week until resolved, Disp: 120 mL, Rfl: 0   nortriptyline (PAMELOR) 25 MG capsule, TAKE 1 CAPSULE AT BEDTIME FOR 7 DAYS, THEN 2 CAPSULES AT BEDTIME FOR 7 DAYS, THEN 3 CAPSULES AT BEDTIME. CONTACT OFFICE FOR REFILLS., Disp: 90 capsule, Rfl: 0   OLANZapine (ZYPREXA) 20 MG tablet, Take 20 mg by mouth at bedtime., Disp: , Rfl:    predniSONE (DELTASONE) 20 MG tablet, Take 2 tablets (40 mg total) by mouth daily., Disp: 10  tablet, Rfl: 0   rizatriptan (MAXALT) 10 MG tablet, Take 1 tablet earliest onset of migraine.  May repeat x1 in 2 hours if needed.  Max 2 tablets in 24h, Disp: 10 tablet, Rfl: 3   SUMAtriptan (IMITREX) 100 MG tablet, Take 1 tablet earliest onset of headache.  May repeat once in 2 hours if headache persists or recurs.  Do not exceed 2 tablets in 24 hours (Patient not taking: Reported on 06/24/2020), Disp: 10 tablet, Rfl: 3   topiramate (TOPAMAX) 25 MG tablet, Take 1 tablet (25 mg total) by mouth at bedtime. (Patient not taking: Reported on 06/24/2020), Disp: 30 tablet, Rfl: 3   triamcinolone cream (KENALOG) 0.1 %, Apply 1 application topically 2 (two) times daily as needed. To rash, Disp: 45 g, Rfl: 0   Ubrogepant (UBRELVY) 100 MG TABS, Take 1 tablet by mouth as needed (May repeat in 2 hours.  Maximum 2 tablets in 24 hours). (Patient not taking: Reported on 06/24/2020), Disp: 10 tablet, Rfl: 11   venlafaxine (EFFEXOR) 75 MG tablet, Take 75 mg by mouth daily., Disp: , Rfl:    Medications ordered in this encounter:  Meds ordered this encounter  Medications   sulfamethoxazole-trimethoprim (BACTRIM DS) 800-160 MG tablet    Sig: Take 1 tablet by mouth 2 (two) times daily.    Dispense:  10 tablet    Refill:  0    Order Specific Question:   Supervising Provider    Answer:  LAMPTEY, PHILIP O [[id number]]   ondansetron (ZOFRAN) 4 MG tablet    Sig: Take 1 tablet (4 mg total) by mouth every 8 (eight) hours as needed for nausea or vomiting.    Dispense:  20 tablet    Refill:  0    Order Specific Question:   Supervising Provider    Answer:   Chase Picket [id number]     *If you need refills on other medications prior to your next appointment, please contact your pharmacy*  Follow-Up: Call back or seek an in-person evaluation if the symptoms worsen or if the condition fails to improve as anticipated.  [place] [phone]  Other Instructions  Urinary Tract Infection, Adult  A  urinary tract infection (UTI) is an infection of any part of the urinary tract. The urinary tract includes the kidneys, ureters, bladder, and urethra. These organs make, store, and get rid of urine in the body. An upper UTI affects the ureters and kidneys. A lower UTI affects the bladder and urethra. What are the causes? Most urinary tract infections are caused by bacteria in your genital area around your urethra, where urine leaves your body. These bacteria grow and cause inflammation of your urinary tract. What increases the risk? You are more likely to develop this condition if: You have a urinary catheter that stays in place. You are not able to control when you urinate or have a bowel movement (incontinence). You are female and you: Use a spermicide or diaphragm for birth control. Have low estrogen levels. Are pregnant. You have certain genes that increase your risk. You are sexually active. You take antibiotic medicines. You have a condition that causes your flow of urine to slow down, such as: An enlarged prostate, if you are female. Blockage in your urethra. A kidney stone. A nerve condition that affects your bladder control (neurogenic bladder). Not getting enough to drink, or not urinating often. You have certain medical conditions, such as: Diabetes. A weak disease-fighting system (immunesystem). Sickle cell disease. Gout. Spinal cord injury. What are the signs or symptoms? Symptoms of this condition include: Needing to urinate right away (urgency). Frequent urination. This may include small amounts of urine each time you urinate. Pain or burning with urination. Blood in the urine. Urine that smells bad or unusual. Trouble urinating. Cloudy urine. Vaginal discharge, if you are female. Pain in the abdomen or the lower back. You may also have: Vomiting or a decreased appetite. Confusion. Irritability or tiredness. A fever or chills. Diarrhea. The first symptom in  older adults may be confusion. In some cases, they may not have any symptoms until the infection has worsened. How is this diagnosed? This condition is diagnosed based on your medical history and a physical exam. You may also have other tests, including: Urine tests. Blood tests. Tests for STIs (sexually transmitted infections). If you have had more than one UTI, a cystoscopy or imaging studies may be done to determine the cause of the infections. How is this treated? Treatment for this condition includes: Antibiotic medicine. Over-the-counter medicines to treat discomfort. Drinking enough water to stay hydrated. If you have frequent infections or have other conditions such as a kidney stone, you may need to see a health care provider who specializes in the urinary tract (urologist). In rare cases, urinary tract infections can cause sepsis. Sepsis is a life-threatening condition that occurs when the body responds to an infection. Sepsis is treated in the hospital with IV antibiotics, fluids, and  other medicines. Follow these instructions at home:  Medicines Take over-the-counter and prescription medicines only as told by your health care provider. If you were prescribed an antibiotic medicine, take it as told by your health care provider. Do not stop using the antibiotic even if you start to feel better. General instructions Make sure you: Empty your bladder often and completely. Do not hold urine for long periods of time. Empty your bladder after sex. Wipe from front to back after urinating or having a bowel movement if you are female. Use each tissue only one time when you wipe. Drink enough fluid to keep your urine pale yellow. Keep all follow-up visits. This is important. Contact a health care provider if: Your symptoms do not get better after 1-2 days. Your symptoms go away and then return. Get help right away if: You have severe pain in your back or your lower abdomen. You have a  fever or chills. You have nausea or vomiting. Summary A urinary tract infection (UTI) is an infection of any part of the urinary tract, which includes the kidneys, ureters, bladder, and urethra. Most urinary tract infections are caused by bacteria in your genital area. Treatment for this condition often includes antibiotic medicines. If you were prescribed an antibiotic medicine, take it as told by your health care provider. Do not stop using the antibiotic even if you start to feel better. Keep all follow-up visits. This is important. This information is not intended to replace advice given to you by your health care provider. Make sure you discuss any questions you have with your health care provider. Document Revised: 02/21/2020 Document Reviewed: 02/21/2020 Elsevier Patient Education  [place].    If you have been instructed to have an in-person evaluation today at a local Urgent Care facility, please use the link below. It will take you to a list of all of our available [facility], including address, phone number and hours of operation. Please do not delay care.  [facility]  If you or a family member do not have a primary care provider, use the link below to schedule a visit and establish care. When you choose a [facility] primary care physician or advanced practice provider, you gain a long-term partner in health. Find a Primary Care Provider  Learn more about [facility]'s in-office and virtual care options: [facility]

## 2022-08-22 NOTE — Progress Notes (Signed)
Virtual Visit Consent   Shelby Shelton, you are scheduled for a virtual visit with a [facility] provider today. Just as with appointments in the office, your consent must be obtained to participate. Your consent will be active for this visit and any virtual visit you may have with one of our providers in the next 365 days. If you have a MyChart account, a copy of this consent can be sent to you electronically.  As this is a virtual visit, video technology does not allow for your provider to perform a traditional examination. This may limit your provider's ability to fully assess your condition. If your provider identifies any concerns that need to be evaluated in person or the need to arrange testing (such as labs, EKG, etc.), we will make arrangements to do so. Although advances in technology are sophisticated, we cannot ensure that it will always work on either your end or our end. If the connection with a video visit is poor, the visit may have to be switched to a telephone visit. With either a video or telephone visit, we are not always able to ensure that we have a secure connection.  By engaging in this virtual visit, you consent to the provision of healthcare and authorize for your insurance to be billed (if applicable) for the services provided during this visit. Depending on your insurance coverage, you may receive a charge related to this service.  I need to obtain your verbal consent now. Are you willing to proceed with your visit today? Julieann Drummonds has provided verbal consent on 08/22/2022 for a virtual visit (video or telephone). Mar Daring, PA-C  Date: 08/22/2022 8:36 AM  Virtual Visit via Video Note   I, Mar Daring, connected with  Olive Zmuda  ([id number], Nov 22, 1990) on 08/22/22 at  8:30 AM EST by a video-enabled telemedicine application and verified that I am speaking with the correct person using two identifiers.  Location: Patient: Virtual Visit  Location Patient: Home Provider: Virtual Visit Location Provider: Home Office   I discussed the limitations of evaluation and management by telemedicine and the availability of in person appointments. The patient expressed understanding and agreed to proceed.    History of Present Illness: Shelby Shelton is a 32 y.o. who identifies as a female who was assigned female at birth, and is being seen today for suspected UTI.  HPI: Urinary Tract Infection  This is a new problem. The current episode started yesterday. The problem occurs every urination. The problem has been gradually worsening. The quality of the pain is described as aching and burning. The pain is mild. There has been no fever. Associated symptoms include frequency, hematuria and hesitancy. Pertinent negatives include no chills, flank pain, nausea, urgency or vomiting. Treatments tried: AZO. The treatment provided no relief. Her past medical history is significant for recurrent UTIs.   Seen on 07/06/22 for UTI and treated with Keflex. Had complete resolution of symptoms. Does report that she holds her urine and does not drink enough water.   Problems: There are no problems to display for this patient.   Allergies: No Known Allergies Medications:  Current Outpatient Medications:    ondansetron (ZOFRAN) 4 MG tablet, Take 1 tablet (4 mg total) by mouth every 8 (eight) hours as needed for nausea or vomiting., Disp: 20 tablet, Rfl: 0   sulfamethoxazole-trimethoprim (BACTRIM DS) 800-160 MG tablet, Take 1 tablet by mouth 2 (two) times daily., Disp: 10 tablet, Rfl: 0   cyclobenzaprine (FLEXERIL) 10 MG tablet,  Take 1 tablet (10 mg total) by mouth 2 (two) times daily as needed for muscle spasms., Disp: 20 tablet, Rfl: 0   Erenumab-aooe (AIMOVIG) 70 MG/ML SOAJ, Inject 70 mg into the skin every 28 (twenty-eight) days., Disp: 1.12 mL, Rfl: 5   hydrOXYzine (ATARAX/VISTARIL) 25 MG tablet, Take 25 mg by mouth 3 (three) times daily as needed for  anxiety., Disp: , Rfl:    ketoconazole (NIZORAL) 2 % shampoo, Apply to back and leave on for 5 minutes, once a day for 1 week, then three times a week until resolved, Disp: 120 mL, Rfl: 0   nortriptyline (PAMELOR) 25 MG capsule, TAKE 1 CAPSULE AT BEDTIME FOR 7 DAYS, THEN 2 CAPSULES AT BEDTIME FOR 7 DAYS, THEN 3 CAPSULES AT BEDTIME. CONTACT OFFICE FOR REFILLS., Disp: 90 capsule, Rfl: 0   OLANZapine (ZYPREXA) 20 MG tablet, Take 20 mg by mouth at bedtime., Disp: , Rfl:    predniSONE (DELTASONE) 20 MG tablet, Take 2 tablets (40 mg total) by mouth daily., Disp: 10 tablet, Rfl: 0   rizatriptan (MAXALT) 10 MG tablet, Take 1 tablet earliest onset of migraine.  May repeat x1 in 2 hours if needed.  Max 2 tablets in 24h, Disp: 10 tablet, Rfl: 3   SUMAtriptan (IMITREX) 100 MG tablet, Take 1 tablet earliest onset of headache.  May repeat once in 2 hours if headache persists or recurs.  Do not exceed 2 tablets in 24 hours (Patient not taking: Reported on 06/24/2020), Disp: 10 tablet, Rfl: 3   topiramate (TOPAMAX) 25 MG tablet, Take 1 tablet (25 mg total) by mouth at bedtime. (Patient not taking: Reported on 06/24/2020), Disp: 30 tablet, Rfl: 3   triamcinolone cream (KENALOG) 0.1 %, Apply 1 application topically 2 (two) times daily as needed. To rash, Disp: 45 g, Rfl: 0   Ubrogepant (UBRELVY) 100 MG TABS, Take 1 tablet by mouth as needed (May repeat in 2 hours.  Maximum 2 tablets in 24 hours). (Patient not taking: Reported on 06/24/2020), Disp: 10 tablet, Rfl: 11   venlafaxine (EFFEXOR) 75 MG tablet, Take 75 mg by mouth daily., Disp: , Rfl:    Observations/Objective: Patient is well-developed, well-nourished in no acute distress.  Resting comfortably at home.  Head is normocephalic, atraumatic.  No labored breathing.  Speech is clear and coherent with logical content.  Patient is alert and oriented at baseline.    Assessment and Plan: 1. Suspected UTI - sulfamethoxazole-trimethoprim (BACTRIM DS) 800-160 MG  tablet; Take 1 tablet by mouth 2 (two) times daily.  Dispense: 10 tablet; Refill: 0  2. Nausea - ondansetron (ZOFRAN) 4 MG tablet; Take 1 tablet (4 mg total) by mouth every 8 (eight) hours as needed for nausea or vomiting.  Dispense: 20 tablet; Refill: 0  - Worsening symptoms.  - Will treat empirically with Bactrim, zofran for nausea side effect from antibiotics - May use AZO for bladder spasms - Continue to push fluids.  - Seek in person evaluation for urine culture if symptoms do not improve or if they worsen.    Follow Up Instructions: I discussed the assessment and treatment plan with the patient. The patient was provided an opportunity to ask questions and all were answered. The patient agreed with the plan and demonstrated an understanding of the instructions.  A copy of instructions were sent to the patient via MyChart unless otherwise noted below.   The patient was advised to call back or seek an in-person evaluation if the symptoms worsen or if the condition  fails to improve as anticipated.  Time:  I spent 10 minutes with the patient via telehealth technology discussing the above problems/concerns.    Mar Daring, PA-C

## 2022-08-24 ENCOUNTER — Ambulatory Visit
Admission: EM | Admit: 2022-08-24 | Discharge: 2022-08-24 | Disposition: A | Discharge status: Home / Self Care | Payer: Medicaid Other | Attending: Internal Medicine | Admitting: Internal Medicine

## 2022-08-24 DIAGNOSIS — Z113 Encounter for screening for infections with a predominantly sexual mode of transmission: Secondary | ICD-10-CM | POA: Insufficient documentation

## 2022-08-24 DIAGNOSIS — N898 Other specified noninflammatory disorders of vagina: Secondary | ICD-10-CM | POA: Diagnosis not present

## 2022-08-24 LAB — POCT URINALYSIS DIP (MANUAL ENTRY)
Bilirubin, UA: NEGATIVE
Glucose, UA: NEGATIVE mg/dL
Ketones, POC UA: NEGATIVE mg/dL
Leukocytes, UA: NEGATIVE
Nitrite, UA: NEGATIVE
Protein Ur, POC: 30 mg/dL — AB
Spec Grav, UA: >=1.03 — AB (ref 1.010–1.025)
Urobilinogen, UA: 0.2 E.U./dL
pH, UA: 6 (ref 5.0–8.0)

## 2022-08-24 LAB — POCT URINE PREGNANCY: Preg Test, Ur: NEGATIVE

## 2022-08-24 NOTE — ED Triage Notes (Signed)
Pt c/o vaginal discharge, irritation,   Onset ~ 1 week ago   States had recent tele health visit and given bactrim, still taking.

## 2022-08-24 NOTE — ED Provider Notes (Addendum)
[name]    CSN: [id number] Arrival date & time: 08/24/22  1830      History   Chief Complaint Chief Complaint  Patient presents with   sti screening    HPI Shelby Shelton is a 32 y.o. female.   Patient presents with yellow vaginal discharge and vaginal irritation that started about a week ago.  Patient reports that she had an e-visit on 08/22/2022 and was prescribed Bactrim for urinary tract infection.  She reports minimal improvement in symptoms.  Vaginal discharge started before UTI symptoms.  Last menstrual cycle was approximately 2 weeks ago.  Patient denies current abdominal pain, fever, dysuria, urinary frequency, abnormal vaginal bleeding, hematuria, back pain.  Reports urinary symptoms resolved with Bactrim administration.  Denies any confirmed exposure to STD but patient would like STD testing today.     Past Medical History:  Diagnosis Date   Anxiety    Depression    Headache    PTSD (post-traumatic stress disorder) 2020   Schizoaffective disorder (HCC)     There are no problems to display for this patient.   Past Surgical History:  Procedure Laterality Date   FINGER SURGERY  2015    OB History   No obstetric history on file.      Home Medications    Prior to Admission medications   Medication Sig Start Date End Date Taking? Authorizing Provider  cyclobenzaprine (FLEXERIL) 10 MG tablet Take 1 tablet (10 mg total) by mouth 2 (two) times daily as needed for muscle spasms. 04/18/22   White, Leitha Schuller, NP  Erenumab-aooe (AIMOVIG) 70 MG/ML SOAJ Inject 70 mg into the skin every 28 (twenty-eight) days. 06/24/20   Pieter Partridge, DO  hydrOXYzine (ATARAX/VISTARIL) 25 MG tablet Take 25 mg by mouth 3 (three) times daily as needed for anxiety.    [provider]  ketoconazole (NIZORAL) 2 % shampoo Apply to back and leave on for 5 minutes, once a day for 1 week, then three times a week until resolved 10/04/19   Darr, Edison Nasuti, PA-C   nortriptyline (PAMELOR) 25 MG capsule TAKE 1 CAPSULE AT BEDTIME FOR 7 DAYS, THEN 2 CAPSULES AT BEDTIME FOR 7 DAYS, THEN 3 CAPSULES AT BEDTIME. CONTACT OFFICE FOR REFILLS. 07/15/19   Tomi Likens, Adam R, DO  OLANZapine (ZYPREXA) 20 MG tablet Take 20 mg by mouth at bedtime.    [provider]  ondansetron (ZOFRAN) 4 MG tablet Take 1 tablet (4 mg total) by mouth every 8 (eight) hours as needed for nausea or vomiting. 08/22/22   Mar Daring, PA-C  predniSONE (DELTASONE) 20 MG tablet Take 2 tablets (40 mg total) by mouth daily. 04/18/22   White, Leitha Schuller, NP  rizatriptan (MAXALT) 10 MG tablet Take 1 tablet earliest onset of migraine.  May repeat x1 in 2 hours if needed.  Max 2 tablets in 24h 06/28/18   Tomi Likens, Adam R, DO  sulfamethoxazole-trimethoprim (BACTRIM DS) 800-160 MG tablet Take 1 tablet by mouth 2 (two) times daily. 08/22/22   Mar Daring, PA-C  SUMAtriptan (IMITREX) 100 MG tablet Take 1 tablet earliest onset of headache.  May repeat once in 2 hours if headache persists or recurs.  Do not exceed 2 tablets in 24 hours Patient not taking: Reported on 06/24/2020 02/14/18   Pieter Partridge, DO  topiramate (TOPAMAX) 25 MG tablet Take 1 tablet (25 mg total) by mouth at bedtime. Patient not taking: Reported on 06/24/2020 09/27/19   Pieter Partridge, DO  triamcinolone  cream (KENALOG) 0.1 % Apply 1 application topically 2 (two) times daily as needed. To rash 09/28/19   Wieters, Hallie C, PA-C  Ubrogepant (UBRELVY) 100 MG TABS Take 1 tablet by mouth as needed (May repeat in 2 hours.  Maximum 2 tablets in 24 hours). Patient not taking: Reported on 06/24/2020 09/27/19   Pieter Partridge, DO  venlafaxine (EFFEXOR) 75 MG tablet Take 75 mg by mouth daily.    [provider]    Family History Family History  Problem Relation Age of Onset   Hypertension Mother    Heart defect Brother    Asthma Brother     Social History Social History   Tobacco Use   Smoking status: Former    Types:  Cigarettes    Quit date: 12/15/2017    Years since quitting: 4.6   Smokeless tobacco: Never  Vaping Use   Vaping Use: Never used  Substance Use Topics   Alcohol use: No   Drug use: No     Allergies   Patient has no known allergies.   Review of Systems Review of Systems Per HPI  Physical Exam Triage Vital Signs ED Triage Vitals [08/24/22 1841]  Enc Vitals Group     BP 128/87     Pulse Rate 99     Resp 16     Temp 98.3 F (36.8 C)     Temp Source Oral     SpO2 97 %     Weight      Height      Head Circumference      Peak Flow      Pain Score 10     Pain Loc      Pain Edu?      Excl. in [place]?    No data found.  Updated Vital Signs BP 128/87 (BP Location: Left Arm)   Pulse 99   Temp 98.3 F (36.8 C) (Oral)   Resp 16   SpO2 97%   Visual Acuity Right Eye Distance:   Left Eye Distance:   Bilateral Distance:    Right Eye Near:   Left Eye Near:    Bilateral Near:     Physical Exam Constitutional:      General: She is not in acute distress.    Appearance: Normal appearance. She is not toxic-appearing or diaphoretic.  HENT:     Head: Normocephalic and atraumatic.  Eyes:     Extraocular Movements: Extraocular movements intact.     Conjunctiva/sclera: Conjunctivae normal.  Cardiovascular:     Rate and Rhythm: Normal rate and regular rhythm.     Pulses: Normal pulses.     Heart sounds: Normal heart sounds.  Pulmonary:     Effort: Pulmonary effort is normal. No respiratory distress.     Breath sounds: Normal breath sounds.  Abdominal:     General: Bowel sounds are normal. There is no distension.     Palpations: Abdomen is soft.     Tenderness: There is no abdominal tenderness.  Genitourinary:    Comments: Deferred with shared decision making. Self swab performed.  Neurological:     General: No focal deficit present.     Mental Status: She is alert and oriented to person, place, and time. Mental status is at baseline.  Psychiatric:        Mood and  Affect: Mood normal.        Behavior: Behavior normal.        Thought Content: Thought content normal.  Judgment: Judgment normal.      UC Treatments / Results  Labs (all labs ordered are listed, but only abnormal results are displayed) Labs Reviewed  POCT URINALYSIS DIP (MANUAL ENTRY) - Abnormal; Notable for the following components:      Result Value   Clarity, UA hazy (*)    Spec Grav, UA >=1.030 (*)    Blood, UA trace-intact (*)    Protein Ur, POC =30 (*)    All other components within normal limits  RPR  HIV ANTIBODY (ROUTINE TESTING W REFLEX)  POCT URINE PREGNANCY  CERVICOVAGINAL ANCILLARY ONLY    EKG   Radiology No results found.  Procedures Procedures (including critical care time)  Medications Ordered in UC Medications - No data to display  Initial Impression / Assessment and Plan / UC Course  I have reviewed the triage vital signs and the nursing notes.  Pertinent labs & imaging results that were available during my care of the patient were reviewed by me and considered in my medical decision making (see chart for details).     UA not indicating urinary tract infection.  Although, patient was advised to continue and complete Bactrim.  Abnormalities on UA are likely due to low p.o. intake or irritation from vaginitis.  Cervicovaginal swab, HIV, RPR test pending.  Given no confirmed exposure to STD, will await results for any further treatment.  Low suspicion for antibiotic induced yeast infection given the vaginal discharge started before Bactrim administration.  Will defer urine culture given no leukocytes on UA.  Patient was advised to follow-up if symptoms persist or worsen.  Patient verbalized understanding and was agreeable with plan. Final Clinical Impressions(s) / UC Diagnoses   Final diagnoses:  Vaginal discharge  Screening examination for venereal disease     Discharge Instructions      Your STD testing is pending. We will call with  results.      ED Prescriptions   None    PDMP not reviewed this encounter.   Teodora Medici, [place] 08/24/22 [place], [place], [place] 08/24/22 [phone]

## 2022-08-24 NOTE — Discharge Instructions (Signed)
Your STD testing is pending. We will call with results.

## 2022-08-25 LAB — CERVICOVAGINAL ANCILLARY ONLY
Bacterial Vaginitis (gardnerella): POSITIVE — AB
Candida Glabrata: NEGATIVE
Candida Vaginitis: NEGATIVE
Chlamydia: NEGATIVE
Comment: NEGATIVE
Comment: NEGATIVE
Comment: NEGATIVE
Comment: NEGATIVE
Comment: NEGATIVE
Comment: NORMAL
Neisseria Gonorrhea: NEGATIVE
Trichomonas: NEGATIVE

## 2022-08-26 ENCOUNTER — Telehealth (HOSPITAL_COMMUNITY): Payer: Self-pay | Admitting: Emergency Medicine

## 2022-08-26 LAB — HIV ANTIBODY (ROUTINE TESTING W REFLEX): HIV Screen 4th Generation wRfx: NONREACTIVE

## 2022-08-26 LAB — RPR: RPR Ser Ql: NONREACTIVE

## 2022-08-26 MED ORDER — METRONIDAZOLE 0.75 % VA GEL: 1.0000 | Freq: Every day | VAGINAL | 0 refills | Status: AC

## 2022-09-19 ENCOUNTER — Ambulatory Visit (INDEPENDENT_AMBULATORY_CARE_PROVIDER_SITE_OTHER): Payer: Medicaid Other

## 2022-09-19 ENCOUNTER — Ambulatory Visit
Admission: EM | Admit: 2022-09-19 | Discharge: 2022-09-19 | Disposition: A | Discharge status: Home / Self Care | Payer: Medicaid Other | Attending: Physician Assistant | Admitting: Physician Assistant

## 2022-09-19 DIAGNOSIS — M25531 Pain in right wrist: Secondary | ICD-10-CM | POA: Diagnosis not present

## 2022-09-19 DIAGNOSIS — S63501A Unspecified sprain of right wrist, initial encounter: Secondary | ICD-10-CM | POA: Diagnosis not present

## 2022-09-19 MED ORDER — DICLOFENAC SODIUM 75 MG PO TBEC: 75.0000 mg | DELAYED_RELEASE_TABLET | Freq: Two times a day (BID) | ORAL | 0 refills | Status: AC

## 2022-09-19 NOTE — Discharge Instructions (Addendum)
Return if any problems.

## 2022-09-19 NOTE — ED Triage Notes (Signed)
Pt presents with right wrist pain X 3 days; pt thinks she may have hurt it horse playing.

## 2022-09-24 NOTE — ED Provider Notes (Signed)
[name]    CSN: [id number] Arrival date & time: 09/19/22  1430      History   Chief Complaint Chief Complaint  Patient presents with   Wrist Pain    HPI Shelby Shelton is a 32 y.o. female.   Patient complains of pain in her right wrist after play fighting.  Patient complains of swelling and discomfort.  Patient denies any other areas of injury she did not have any impact of her head or loss of consciousness  The history is provided by the patient. No language interpreter was used.  Wrist Pain The problem occurs constantly. Nothing aggravates the symptoms. She has tried nothing for the symptoms. The treatment provided no relief.    Past Medical History:  Diagnosis Date   Anxiety    Depression    Headache    PTSD (post-traumatic stress disorder) 2020   Schizoaffective disorder (HCC)     There are no problems to display for this patient.   Past Surgical History:  Procedure Laterality Date   FINGER SURGERY  2015    OB History   No obstetric history on file.      Home Medications    Prior to Admission medications   Medication Sig Start Date End Date Taking? Authorizing Provider  diclofenac (VOLTAREN) 75 MG EC tablet Take 1 tablet (75 mg total) by mouth 2 (two) times daily. 09/19/22  Yes Caryl Ada K, PA-C  cyclobenzaprine (FLEXERIL) 10 MG tablet Take 1 tablet (10 mg total) by mouth 2 (two) times daily as needed for muscle spasms. 04/18/22   White, Leitha Schuller, NP  Erenumab-aooe (AIMOVIG) 70 MG/ML SOAJ Inject 70 mg into the skin every 28 (twenty-eight) days. 06/24/20   Pieter Partridge, DO  hydrOXYzine (ATARAX/VISTARIL) 25 MG tablet Take 25 mg by mouth 3 (three) times daily as needed for anxiety.    [provider]  ketoconazole (NIZORAL) 2 % shampoo Apply to back and leave on for 5 minutes, once a day for 1 week, then three times a week until resolved 10/04/19   Darr, Edison Nasuti, PA-C  nortriptyline (PAMELOR) 25 MG capsule TAKE 1 CAPSULE AT  BEDTIME FOR 7 DAYS, THEN 2 CAPSULES AT BEDTIME FOR 7 DAYS, THEN 3 CAPSULES AT BEDTIME. CONTACT OFFICE FOR REFILLS. 07/15/19   Tomi Likens, Adam R, DO  OLANZapine (ZYPREXA) 20 MG tablet Take 20 mg by mouth at bedtime.    [provider]  ondansetron (ZOFRAN) 4 MG tablet Take 1 tablet (4 mg total) by mouth every 8 (eight) hours as needed for nausea or vomiting. 08/22/22   Mar Daring, PA-C  predniSONE (DELTASONE) 20 MG tablet Take 2 tablets (40 mg total) by mouth daily. 04/18/22   White, Leitha Schuller, NP  rizatriptan (MAXALT) 10 MG tablet Take 1 tablet earliest onset of migraine.  May repeat x1 in 2 hours if needed.  Max 2 tablets in 24h 06/28/18   Tomi Likens, Adam R, DO  sulfamethoxazole-trimethoprim (BACTRIM DS) 800-160 MG tablet Take 1 tablet by mouth 2 (two) times daily. 08/22/22   Mar Daring, PA-C  SUMAtriptan (IMITREX) 100 MG tablet Take 1 tablet earliest onset of headache.  May repeat once in 2 hours if headache persists or recurs.  Do not exceed 2 tablets in 24 hours Patient not taking: Reported on 06/24/2020 02/14/18   Pieter Partridge, DO  topiramate (TOPAMAX) 25 MG tablet Take 1 tablet (25 mg total) by mouth at bedtime. Patient not taking: Reported on 06/24/2020 09/27/19  Tomi Likens, Adam R, DO  triamcinolone cream (KENALOG) 0.1 % Apply 1 application topically 2 (two) times daily as needed. To rash 09/28/19   Wieters, Hallie C, PA-C  Ubrogepant (UBRELVY) 100 MG TABS Take 1 tablet by mouth as needed (May repeat in 2 hours.  Maximum 2 tablets in 24 hours). Patient not taking: Reported on 06/24/2020 09/27/19   Pieter Partridge, DO  venlafaxine (EFFEXOR) 75 MG tablet Take 75 mg by mouth daily.    [provider]    Family History Family History  Problem Relation Age of Onset   Hypertension Mother    Heart defect Brother    Asthma Brother     Social History Social History   Tobacco Use   Smoking status: Former    Types: Cigarettes    Quit date: 12/15/2017    Years since quitting:  4.7   Smokeless tobacco: Never  Vaping Use   Vaping Use: Never used  Substance Use Topics   Alcohol use: No   Drug use: No     Allergies   Patient has no known allergies.   Review of Systems Review of Systems  All other systems reviewed and are negative.    Physical Exam Triage Vital Signs ED Triage Vitals  Enc Vitals Group     BP 09/19/22 1610 (!) 131/90     Pulse Rate 09/19/22 1610 79     Resp 09/19/22 1610 18     Temp 09/19/22 1610 98.5 F (36.9 C)     Temp Source 09/19/22 1610 Oral     SpO2 09/19/22 1610 96 %     Weight --      Height --      Head Circumference --      Peak Flow --      Pain Score 09/19/22 1612 8     Pain Loc --      Pain Edu? --      Excl. in [place]? --    No data found.  Updated Vital Signs BP (!) 131/90 (BP Location: Left Arm)   Pulse 79   Temp 98.5 F (36.9 C) (Oral)   Resp 18   LMP 09/09/2022   SpO2 96%   Visual Acuity Right Eye Distance:   Left Eye Distance:   Bilateral Distance:    Right Eye Near:   Left Eye Near:    Bilateral Near:     Physical Exam Vitals and nursing note reviewed.  Constitutional:      Appearance: She is well-developed.  HENT:     Head: Normocephalic.  Cardiovascular:     Rate and Rhythm: Normal rate.  Pulmonary:     Effort: Pulmonary effort is normal.  Abdominal:     General: There is no distension.  Musculoskeletal:        General: Swelling and tenderness present.     Cervical back: Normal range of motion.     Comments: Tender right wrist full range of motion neurovascular neurosensory are intact  Skin:    General: Skin is warm.  Neurological:     General: No focal deficit present.     Mental Status: She is alert and oriented to person, place, and time.      UC Treatments / Results  Labs (all labs ordered are listed, but only abnormal results are displayed) Labs Reviewed - No data to display  EKG   Radiology No results found.  Procedures Procedures (including critical care  time)  Medications Ordered in UC  Medications - No data to display  Initial Impression / Assessment and Plan / UC Course  I have reviewed the triage vital signs and the nursing notes.  Pertinent labs & imaging results that were available during my care of the patient were reviewed by me and considered in my medical decision making (see chart for details).      Final Clinical Impressions(s) / UC Diagnoses   Final diagnoses:  Wrist sprain, right, initial encounter     Discharge Instructions      Return if any problems.     ED Prescriptions     Medication Sig Dispense Auth. Provider   diclofenac (VOLTAREN) 75 MG EC tablet Take 1 tablet (75 mg total) by mouth 2 (two) times daily. 20 tablet Fransico Meadow, [place]      PDMP not reviewed this encounter. An After Visit Summary was printed and given to the patient.       Fransico Meadow, [place] 09/24/22 1442

## 2023-01-16 ENCOUNTER — Telehealth: Payer: Medicaid Other | Admitting: Family Medicine

## 2023-01-16 DIAGNOSIS — R3989 Other symptoms and signs involving the genitourinary system: Secondary | ICD-10-CM | POA: Diagnosis not present

## 2023-01-16 DIAGNOSIS — R110 Nausea: Secondary | ICD-10-CM

## 2023-01-16 MED ORDER — ONDANSETRON HCL 4 MG PO TABS: 4.0000 mg | ORAL_TABLET | Freq: Three times a day (TID) | ORAL | 0 refills | Status: AC | PRN

## 2023-01-16 MED ORDER — NITROFURANTOIN MONOHYD MACRO 100 MG PO CAPS: 100.0000 mg | ORAL_CAPSULE | Freq: Two times a day (BID) | ORAL | 0 refills | Status: AC

## 2023-01-16 NOTE — Progress Notes (Signed)
Virtual Visit Consent   Shelby Shelton, you are scheduled for a virtual visit with a [facility] provider today. Just as with appointments in the office, your consent must be obtained to participate. Your consent will be active for this visit and any virtual visit you may have with one of our providers in the next 365 days. If you have a MyChart account, a copy of this consent can be sent to you electronically.  As this is a virtual visit, video technology does not allow for your provider to perform a traditional examination. This may limit your provider's ability to fully assess your condition. If your provider identifies any concerns that need to be evaluated in person or the need to arrange testing (such as labs, EKG, etc.), we will make arrangements to do so. Although advances in technology are sophisticated, we cannot ensure that it will always work on either your end or our end. If the connection with a video visit is poor, the visit may have to be switched to a telephone visit. With either a video or telephone visit, we are not always able to ensure that we have a secure connection.  By engaging in this virtual visit, you consent to the provision of healthcare and authorize for your insurance to be billed (if applicable) for the services provided during this visit. Depending on your insurance coverage, you may receive a charge related to this service.  I need to obtain your verbal consent now. Are you willing to proceed with your visit today? Jezreel Sisk has provided verbal consent on 01/16/2023 for a virtual visit (video or telephone). Freddy Finner, NP  Date: 01/16/2023 2:50 PM  Virtual Visit via Video Note   I, Freddy Finner, connected with  Maguire Killmer  ([id number], Oct 30, 1990) on 01/16/23 at  3:15 PM EDT by a video-enabled telemedicine application and verified that I am speaking with the correct person using two identifiers.  Location: Patient: Virtual Visit Location Patient:  Home Provider: Virtual Visit Location Provider: Home Office   I discussed the limitations of evaluation and management by telemedicine and the availability of in person appointments. The patient expressed understanding and agreed to proceed.    History of Present Illness: Shelby Shelton is a 32 y.o. who identifies as a female who was assigned female at birth, and is being seen today for UTI   Onset was last night- post bath Associated symptoms are increased urination, aching and some burning, voiding only a little at a time, small amount of blood- mostly on tissue.  Modifying factors are nothing as of yet Denies fevers, chills, flank pain, nausea, vomiting, or urgency.   History of UTIs in past with some recurrent. Unable to take antibiotics without zofran due to n/v it will cause  Problems: There are no problems to display for this patient.   Allergies: No Known Allergies Medications:  Current Outpatient Medications:    cyclobenzaprine (FLEXERIL) 10 MG tablet, Take 1 tablet (10 mg total) by mouth 2 (two) times daily as needed for muscle spasms., Disp: 20 tablet, Rfl: 0   diclofenac (VOLTAREN) 75 MG EC tablet, Take 1 tablet (75 mg total) by mouth 2 (two) times daily., Disp: 20 tablet, Rfl: 0   Erenumab-aooe (AIMOVIG) 70 MG/ML SOAJ, Inject 70 mg into the skin every 28 (twenty-eight) days., Disp: 1.12 mL, Rfl: 5   hydrOXYzine (ATARAX/VISTARIL) 25 MG tablet, Take 25 mg by mouth 3 (three) times daily as needed for anxiety., Disp: , Rfl:  ketoconazole (NIZORAL) 2 % shampoo, Apply to back and leave on for 5 minutes, once a day for 1 week, then three times a week until resolved, Disp: 120 mL, Rfl: 0   nortriptyline (PAMELOR) 25 MG capsule, TAKE 1 CAPSULE AT BEDTIME FOR 7 DAYS, THEN 2 CAPSULES AT BEDTIME FOR 7 DAYS, THEN 3 CAPSULES AT BEDTIME. CONTACT OFFICE FOR REFILLS., Disp: 90 capsule, Rfl: 0   OLANZapine (ZYPREXA) 20 MG tablet, Take 20 mg by mouth at bedtime., Disp: , Rfl:    ondansetron  (ZOFRAN) 4 MG tablet, Take 1 tablet (4 mg total) by mouth every 8 (eight) hours as needed for nausea or vomiting., Disp: 20 tablet, Rfl: 0   predniSONE (DELTASONE) 20 MG tablet, Take 2 tablets (40 mg total) by mouth daily., Disp: 10 tablet, Rfl: 0   rizatriptan (MAXALT) 10 MG tablet, Take 1 tablet earliest onset of migraine.  May repeat x1 in 2 hours if needed.  Max 2 tablets in 24h, Disp: 10 tablet, Rfl: 3   sulfamethoxazole-trimethoprim (BACTRIM DS) 800-160 MG tablet, Take 1 tablet by mouth 2 (two) times daily., Disp: 10 tablet, Rfl: 0   SUMAtriptan (IMITREX) 100 MG tablet, Take 1 tablet earliest onset of headache.  May repeat once in 2 hours if headache persists or recurs.  Do not exceed 2 tablets in 24 hours (Patient not taking: Reported on 06/24/2020), Disp: 10 tablet, Rfl: 3   topiramate (TOPAMAX) 25 MG tablet, Take 1 tablet (25 mg total) by mouth at bedtime. (Patient not taking: Reported on 06/24/2020), Disp: 30 tablet, Rfl: 3   triamcinolone cream (KENALOG) 0.1 %, Apply 1 application topically 2 (two) times daily as needed. To rash, Disp: 45 g, Rfl: 0   Ubrogepant (UBRELVY) 100 MG TABS, Take 1 tablet by mouth as needed (May repeat in 2 hours.  Maximum 2 tablets in 24 hours). (Patient not taking: Reported on 06/24/2020), Disp: 10 tablet, Rfl: 11   venlafaxine (EFFEXOR) 75 MG tablet, Take 75 mg by mouth daily., Disp: , Rfl:   Observations/Objective: Patient is well-developed, well-nourished in no acute distress.  Resting comfortably  at home.  Head is normocephalic, atraumatic.  No labored breathing.  Speech is clear and coherent with logical content.  Patient is alert and oriented at baseline.    Assessment and Plan:   1. Suspected UTI  - nitrofurantoin, macrocrystal-monohydrate, (MACROBID) 100 MG capsule; Take 1 capsule (100 mg total) by mouth 2 (two) times daily for 5 days.  Dispense: 10 capsule; Refill: 0  2. Nausea  - ondansetron (ZOFRAN) 4 MG tablet; Take 1 tablet (4 mg total) by  mouth every 8 (eight) hours as needed for nausea or vomiting.  Dispense: 10 tablet; Refill: 0   -long discussion on bath and soaps and UTI causes, info on avs -zofran for nausea and vomiting side effects from antibiotics -push fluids -Seek in person evaluation for urine culture if symptoms do not improve or if they worsen.     Reviewed side effects, risks and benefits of medication.    Patient acknowledged agreement and understanding of the plan.   Past Medical, Surgical, Social History, Allergies, and Medications have been Reviewed.   Follow Up Instructions: I discussed the assessment and treatment plan with the patient. The patient was provided an opportunity to ask questions and all were answered. The patient agreed with the plan and demonstrated an understanding of the instructions.  A copy of instructions were sent to the patient via MyChart unless otherwise noted below.  The patient was advised to call back or seek an in-person evaluation if the symptoms worsen or if the condition fails to improve as anticipated.  Time:  I spent 10 minutes with the patient via telehealth technology discussing the above problems/concerns.    Freddy Finner, NP

## 2023-01-16 NOTE — Patient Instructions (Signed)
Shelby Shelton, thank you for joining Freddy Finner, NP for today's virtual visit.  While this provider is not your primary care provider (PCP), if your PCP is located in our provider database this encounter information will be shared with them immediately following your visit.   A [facility] MyChart account gives you access to today's visit and all your visits, tests, and labs performed at [facility] " click here if you don't have a [facility] MyChart account or go to [email or web address]  Consent: (Patient) Shelby Shelton provided verbal consent for this virtual visit at the beginning of the encounter.  Current Medications:  Current Outpatient Medications:    nitrofurantoin, macrocrystal-monohydrate, (MACROBID) 100 MG capsule, Take 1 capsule (100 mg total) by mouth 2 (two) times daily for 5 days., Disp: 10 capsule, Rfl: 0   ondansetron (ZOFRAN) 4 MG tablet, Take 1 tablet (4 mg total) by mouth every 8 (eight) hours as needed for nausea or vomiting., Disp: 10 tablet, Rfl: 0   cyclobenzaprine (FLEXERIL) 10 MG tablet, Take 1 tablet (10 mg total) by mouth 2 (two) times daily as needed for muscle spasms., Disp: 20 tablet, Rfl: 0   diclofenac (VOLTAREN) 75 MG EC tablet, Take 1 tablet (75 mg total) by mouth 2 (two) times daily., Disp: 20 tablet, Rfl: 0   Erenumab-aooe (AIMOVIG) 70 MG/ML SOAJ, Inject 70 mg into the skin every 28 (twenty-eight) days., Disp: 1.12 mL, Rfl: 5   hydrOXYzine (ATARAX/VISTARIL) 25 MG tablet, Take 25 mg by mouth 3 (three) times daily as needed for anxiety., Disp: , Rfl:    ketoconazole (NIZORAL) 2 % shampoo, Apply to back and leave on for 5 minutes, once a day for 1 week, then three times a week until resolved, Disp: 120 mL, Rfl: 0   nortriptyline (PAMELOR) 25 MG capsule, TAKE 1 CAPSULE AT BEDTIME FOR 7 DAYS, THEN 2 CAPSULES AT BEDTIME FOR 7 DAYS, THEN 3 CAPSULES AT BEDTIME. CONTACT OFFICE FOR REFILLS., Disp: 90 capsule, Rfl: 0   OLANZapine (ZYPREXA) 20  MG tablet, Take 20 mg by mouth at bedtime., Disp: , Rfl:    predniSONE (DELTASONE) 20 MG tablet, Take 2 tablets (40 mg total) by mouth daily., Disp: 10 tablet, Rfl: 0   rizatriptan (MAXALT) 10 MG tablet, Take 1 tablet earliest onset of migraine.  May repeat x1 in 2 hours if needed.  Max 2 tablets in 24h, Disp: 10 tablet, Rfl: 3   sulfamethoxazole-trimethoprim (BACTRIM DS) 800-160 MG tablet, Take 1 tablet by mouth 2 (two) times daily., Disp: 10 tablet, Rfl: 0   SUMAtriptan (IMITREX) 100 MG tablet, Take 1 tablet earliest onset of headache.  May repeat once in 2 hours if headache persists or recurs.  Do not exceed 2 tablets in 24 hours (Patient not taking: Reported on 06/24/2020), Disp: 10 tablet, Rfl: 3   topiramate (TOPAMAX) 25 MG tablet, Take 1 tablet (25 mg total) by mouth at bedtime. (Patient not taking: Reported on 06/24/2020), Disp: 30 tablet, Rfl: 3   triamcinolone cream (KENALOG) 0.1 %, Apply 1 application topically 2 (two) times daily as needed. To rash, Disp: 45 g, Rfl: 0   Ubrogepant (UBRELVY) 100 MG TABS, Take 1 tablet by mouth as needed (May repeat in 2 hours.  Maximum 2 tablets in 24 hours). (Patient not taking: Reported on 06/24/2020), Disp: 10 tablet, Rfl: 11   venlafaxine (EFFEXOR) 75 MG tablet, Take 75 mg by mouth daily., Disp: , Rfl:    Medications ordered in this encounter:  Meds ordered this  encounter  Medications   nitrofurantoin, macrocrystal-monohydrate, (MACROBID) 100 MG capsule    Sig: Take 1 capsule (100 mg total) by mouth 2 (two) times daily for 5 days.    Dispense:  10 capsule    Refill:  0    Order Specific Question:   Supervising Provider    Answer:   LAMPTEY, PHILIP O [[id number]]   ondansetron (ZOFRAN) 4 MG tablet    Sig: Take 1 tablet (4 mg total) by mouth every 8 (eight) hours as needed for nausea or vomiting.    Dispense:  10 tablet    Refill:  0    Order Specific Question:   Supervising Provider    Answer:   Merrilee Jansky [id number]     *If you need refills  on other medications prior to your next appointment, please contact your pharmacy*  Follow-Up: Call back or seek an in-person evaluation if the symptoms worsen or if the condition fails to improve as anticipated.  [facility] [phone]  Other Instructions  Urinary Tract Infection, Adult A urinary tract infection (UTI) is an infection of any part of the urinary tract. The urinary tract includes: The kidneys. The ureters. The bladder. The urethra. These organs make, store, and get rid of pee (urine) in the body. What are the causes? This infection is caused by germs (bacteria) in your genital area. These germs grow and cause swelling (inflammation) of your urinary tract. What increases the risk? The following factors may make you more likely to develop this condition: Using a small, thin tube (catheter) to drain pee. Not being able to control when you pee or poop (incontinence). Being female. If you are female, these things can increase the risk: Using these methods to prevent pregnancy: A medicine that kills sperm (spermicide). A device that blocks sperm (diaphragm). Having low levels of a female hormone (estrogen). Being pregnant. You are more likely to develop this condition if: You have genes that add to your risk. You are sexually active. You take antibiotic medicines. You have trouble peeing because of: A prostate that is bigger than normal, if you are female. A blockage in the part of your body that drains pee from the bladder. A kidney stone. A nerve condition that affects your bladder. Not getting enough to drink. Not peeing often enough. You have other conditions, such as: Diabetes. A weak disease-fighting system (immune system). Sickle cell disease. Gout. Injury of the spine. What are the signs or symptoms? Symptoms of this condition include: Needing to pee right away. Peeing small amounts often. Pain or burning when peeing. Blood in the  pee. Pee that smells bad or not like normal. Trouble peeing. Pee that is cloudy. Fluid coming from the vagina, if you are female. Pain in the belly or lower back. Other symptoms include: Vomiting. Not feeling hungry. Feeling mixed up (confused). This may be the first symptom in older adults. Being tired and grouchy (irritable). A fever. Watery poop (diarrhea). How is this treated? Taking antibiotic medicine. Taking other medicines. Drinking enough water. In some cases, you may need to see a specialist. Follow these instructions at home:  Medicines Take over-the-counter and prescription medicines only as told by your doctor. If you were prescribed an antibiotic medicine, take it as told by your doctor. Do not stop taking it even if you start to feel better. General instructions Make sure you: Pee until your bladder is empty. Do not hold pee for a long time. Empty your bladder  after sex. Wipe from front to back after peeing or pooping if you are a female. Use each tissue one time when you wipe. Drink enough fluid to keep your pee pale yellow. Keep all follow-up visits. Contact a doctor if: You do not get better after 1-2 days. Your symptoms go away and then come back. Get help right away if: You have very bad back pain. You have very bad pain in your lower belly. You have a fever. You have chills. You feeling like you will vomit or you vomit. Summary A urinary tract infection (UTI) is an infection of any part of the urinary tract. This condition is caused by germs in your genital area. There are many risk factors for a UTI. Treatment includes antibiotic medicines. Drink enough fluid to keep your pee pale yellow. This information is not intended to replace advice given to you by your health care provider. Make sure you discuss any questions you have with your health care provider. Document Revised: 02/16/2020 Document Reviewed: 02/21/2020 Elsevier Patient Education  2024  [employer]    If you have been instructed to have an in-person evaluation today at a local Urgent Care facility, please use the link below. It will take you to a list of all of our available [facility], including address, phone number and hours of operation. Please do not delay care.  [facility]  If you or a family member do not have a primary care provider, use the link below to schedule a visit and establish care. When you choose a [facility] primary care physician or advanced practice provider, you gain a long-term partner in health. Find a Primary Care Provider  Learn more about [facility]'s in-office and virtual care options:  - Get Care Now

## 2023-11-14 ENCOUNTER — Ambulatory Visit: Payer: Self-pay
